# Patient Record
Sex: Female | Born: 1950 | Race: White | Hispanic: No | Marital: Married | Smoking: Former smoker
Health system: Southern US, Community
[De-identification: ages and names within clinical notes are randomized; demographics above are authoritative.]

## PROBLEM LIST (undated history)

## (undated) DIAGNOSIS — I10 Essential (primary) hypertension: Secondary | ICD-10-CM

## (undated) DIAGNOSIS — I6529 Occlusion and stenosis of unspecified carotid artery: Secondary | ICD-10-CM

## (undated) DIAGNOSIS — I2510 Atherosclerotic heart disease of native coronary artery without angina pectoris: Secondary | ICD-10-CM

## (undated) DIAGNOSIS — I4891 Unspecified atrial fibrillation: Secondary | ICD-10-CM

## (undated) DIAGNOSIS — I499 Cardiac arrhythmia, unspecified: Secondary | ICD-10-CM

## (undated) HISTORY — DX: Occlusion and stenosis of unspecified carotid artery: I65.29

## (undated) HISTORY — PX: ABDOMINAL HYSTERECTOMY: SHX81

## (undated) HISTORY — PX: TONSILLECTOMY: SUR1361

## (undated) HISTORY — PX: DILATION AND CURETTAGE OF UTERUS: SHX78

---

## 2018-02-12 ENCOUNTER — Other Ambulatory Visit: Payer: Self-pay | Admitting: Family Medicine

## 2018-02-12 DIAGNOSIS — M545 Low back pain: Secondary | ICD-10-CM

## 2018-02-22 ENCOUNTER — Ambulatory Visit
Admission: RE | Admit: 2018-02-22 | Discharge: 2018-02-22 | Disposition: A | Discharge status: Home / Self Care | Payer: Medicare PPO | Source: Ambulatory Visit | Attending: Family Medicine | Admitting: Family Medicine

## 2018-02-22 DIAGNOSIS — M545 Low back pain: Secondary | ICD-10-CM

## 2018-02-26 ENCOUNTER — Other Ambulatory Visit: Payer: Self-pay | Admitting: Family Medicine

## 2018-02-26 DIAGNOSIS — M545 Low back pain: Principal | ICD-10-CM

## 2018-02-26 DIAGNOSIS — G8929 Other chronic pain: Secondary | ICD-10-CM

## 2018-03-15 ENCOUNTER — Ambulatory Visit
Admission: RE | Admit: 2018-03-15 | Discharge: 2018-03-15 | Disposition: A | Discharge status: Home / Self Care | Payer: Medicare PPO | Source: Ambulatory Visit | Attending: Family Medicine | Admitting: Family Medicine

## 2018-03-15 DIAGNOSIS — M47817 Spondylosis without myelopathy or radiculopathy, lumbosacral region: Secondary | ICD-10-CM | POA: Diagnosis not present

## 2018-03-15 DIAGNOSIS — G8929 Other chronic pain: Secondary | ICD-10-CM

## 2018-03-15 DIAGNOSIS — M545 Low back pain: Principal | ICD-10-CM

## 2018-03-15 MED ORDER — METHYLPREDNISOLONE ACETATE 40 MG/ML INJ SUSP (RADIOLOG
120.0000 mg | Freq: Once | INTRAMUSCULAR | Status: AC
  Administered 2018-03-15: 120 mg via EPIDURAL

## 2018-03-15 MED ORDER — IOPAMIDOL (ISOVUE-M 200) INJECTION 41%
1.0000 mL | Freq: Once | INTRAMUSCULAR | Status: AC
  Administered 2018-03-15: 1 mL via EPIDURAL

## 2018-03-15 NOTE — Discharge Instructions (Signed)

## 2018-04-13 DIAGNOSIS — M545 Low back pain: Secondary | ICD-10-CM | POA: Diagnosis not present

## 2018-04-13 DIAGNOSIS — Z6828 Body mass index (BMI) 28.0-28.9, adult: Secondary | ICD-10-CM | POA: Diagnosis not present

## 2018-04-22 ENCOUNTER — Other Ambulatory Visit (HOSPITAL_COMMUNITY): Payer: Self-pay | Admitting: Neurosurgery

## 2018-04-22 DIAGNOSIS — M859 Disorder of bone density and structure, unspecified: Secondary | ICD-10-CM | POA: Diagnosis not present

## 2018-04-22 DIAGNOSIS — M4316 Spondylolisthesis, lumbar region: Secondary | ICD-10-CM | POA: Diagnosis not present

## 2018-04-22 DIAGNOSIS — E2839 Other primary ovarian failure: Secondary | ICD-10-CM

## 2018-04-22 DIAGNOSIS — M48062 Spinal stenosis, lumbar region with neurogenic claudication: Secondary | ICD-10-CM | POA: Diagnosis not present

## 2018-04-22 DIAGNOSIS — M47816 Spondylosis without myelopathy or radiculopathy, lumbar region: Secondary | ICD-10-CM | POA: Diagnosis not present

## 2018-04-28 DIAGNOSIS — M48061 Spinal stenosis, lumbar region without neurogenic claudication: Secondary | ICD-10-CM | POA: Diagnosis not present

## 2018-04-28 DIAGNOSIS — M48062 Spinal stenosis, lumbar region with neurogenic claudication: Secondary | ICD-10-CM | POA: Diagnosis not present

## 2018-04-28 DIAGNOSIS — M5126 Other intervertebral disc displacement, lumbar region: Secondary | ICD-10-CM | POA: Diagnosis not present

## 2018-04-28 DIAGNOSIS — I700 Atherosclerosis of aorta: Secondary | ICD-10-CM | POA: Diagnosis not present

## 2018-04-28 DIAGNOSIS — M545 Low back pain: Secondary | ICD-10-CM | POA: Diagnosis not present

## 2018-04-28 DIAGNOSIS — M47816 Spondylosis without myelopathy or radiculopathy, lumbar region: Secondary | ICD-10-CM | POA: Diagnosis not present

## 2018-04-28 DIAGNOSIS — K5732 Diverticulitis of large intestine without perforation or abscess without bleeding: Secondary | ICD-10-CM | POA: Diagnosis not present

## 2018-04-28 DIAGNOSIS — Z6828 Body mass index (BMI) 28.0-28.9, adult: Secondary | ICD-10-CM | POA: Diagnosis not present

## 2018-05-03 ENCOUNTER — Ambulatory Visit (HOSPITAL_COMMUNITY)
Admission: RE | Admit: 2018-05-03 | Discharge: 2018-05-03 | Disposition: A | Discharge status: Home / Self Care | Payer: Medicare PPO | Source: Ambulatory Visit | Attending: Neurosurgery | Admitting: Neurosurgery

## 2018-05-03 ENCOUNTER — Encounter (HOSPITAL_COMMUNITY): Payer: Self-pay | Admitting: Radiology

## 2018-05-03 DIAGNOSIS — E2839 Other primary ovarian failure: Secondary | ICD-10-CM | POA: Insufficient documentation

## 2018-05-03 DIAGNOSIS — M47816 Spondylosis without myelopathy or radiculopathy, lumbar region: Secondary | ICD-10-CM | POA: Diagnosis not present

## 2018-05-03 DIAGNOSIS — M48062 Spinal stenosis, lumbar region with neurogenic claudication: Secondary | ICD-10-CM | POA: Diagnosis not present

## 2018-05-03 DIAGNOSIS — M8589 Other specified disorders of bone density and structure, multiple sites: Secondary | ICD-10-CM | POA: Diagnosis not present

## 2018-05-03 DIAGNOSIS — M4316 Spondylolisthesis, lumbar region: Secondary | ICD-10-CM | POA: Diagnosis not present

## 2018-05-03 DIAGNOSIS — Z780 Asymptomatic menopausal state: Secondary | ICD-10-CM | POA: Diagnosis not present

## 2018-05-08 DIAGNOSIS — Z23 Encounter for immunization: Secondary | ICD-10-CM | POA: Diagnosis not present

## 2018-05-14 DIAGNOSIS — M48062 Spinal stenosis, lumbar region with neurogenic claudication: Secondary | ICD-10-CM | POA: Diagnosis not present

## 2018-05-14 DIAGNOSIS — M47816 Spondylosis without myelopathy or radiculopathy, lumbar region: Secondary | ICD-10-CM | POA: Diagnosis not present

## 2018-05-17 DIAGNOSIS — D6859 Other primary thrombophilia: Secondary | ICD-10-CM | POA: Diagnosis not present

## 2018-05-17 DIAGNOSIS — Z86718 Personal history of other venous thrombosis and embolism: Secondary | ICD-10-CM | POA: Diagnosis not present

## 2018-05-17 DIAGNOSIS — M48062 Spinal stenosis, lumbar region with neurogenic claudication: Secondary | ICD-10-CM | POA: Diagnosis not present

## 2018-05-17 DIAGNOSIS — Z8249 Family history of ischemic heart disease and other diseases of the circulatory system: Secondary | ICD-10-CM | POA: Diagnosis not present

## 2018-05-28 DIAGNOSIS — I10 Essential (primary) hypertension: Secondary | ICD-10-CM | POA: Diagnosis not present

## 2018-05-28 DIAGNOSIS — J329 Chronic sinusitis, unspecified: Secondary | ICD-10-CM | POA: Diagnosis not present

## 2018-05-28 DIAGNOSIS — M545 Low back pain: Secondary | ICD-10-CM | POA: Diagnosis not present

## 2018-05-31 DIAGNOSIS — Z01810 Encounter for preprocedural cardiovascular examination: Secondary | ICD-10-CM | POA: Diagnosis not present

## 2018-05-31 DIAGNOSIS — Z01818 Encounter for other preprocedural examination: Secondary | ICD-10-CM | POA: Diagnosis not present

## 2018-05-31 DIAGNOSIS — M48062 Spinal stenosis, lumbar region with neurogenic claudication: Secondary | ICD-10-CM | POA: Diagnosis not present

## 2018-05-31 DIAGNOSIS — Z538 Procedure and treatment not carried out for other reasons: Secondary | ICD-10-CM | POA: Diagnosis not present

## 2018-05-31 DIAGNOSIS — J90 Pleural effusion, not elsewhere classified: Secondary | ICD-10-CM | POA: Diagnosis not present

## 2018-05-31 DIAGNOSIS — R9431 Abnormal electrocardiogram [ECG] [EKG]: Secondary | ICD-10-CM | POA: Diagnosis not present

## 2018-05-31 DIAGNOSIS — I4891 Unspecified atrial fibrillation: Secondary | ICD-10-CM | POA: Diagnosis not present

## 2018-06-01 DIAGNOSIS — M48062 Spinal stenosis, lumbar region with neurogenic claudication: Secondary | ICD-10-CM | POA: Diagnosis not present

## 2018-06-01 DIAGNOSIS — Z881 Allergy status to other antibiotic agents status: Secondary | ICD-10-CM | POA: Diagnosis not present

## 2018-06-01 DIAGNOSIS — I4891 Unspecified atrial fibrillation: Secondary | ICD-10-CM | POA: Diagnosis not present

## 2018-06-01 DIAGNOSIS — Z79899 Other long term (current) drug therapy: Secondary | ICD-10-CM | POA: Diagnosis not present

## 2018-06-01 DIAGNOSIS — D6859 Other primary thrombophilia: Secondary | ICD-10-CM | POA: Diagnosis not present

## 2018-06-01 DIAGNOSIS — Z888 Allergy status to other drugs, medicaments and biological substances status: Secondary | ICD-10-CM | POA: Diagnosis not present

## 2018-06-01 DIAGNOSIS — Z7901 Long term (current) use of anticoagulants: Secondary | ICD-10-CM | POA: Diagnosis not present

## 2018-06-01 DIAGNOSIS — I509 Heart failure, unspecified: Secondary | ICD-10-CM | POA: Diagnosis not present

## 2018-06-01 DIAGNOSIS — G629 Polyneuropathy, unspecified: Secondary | ICD-10-CM | POA: Diagnosis not present

## 2018-06-01 DIAGNOSIS — I110 Hypertensive heart disease with heart failure: Secondary | ICD-10-CM | POA: Diagnosis not present

## 2018-06-01 DIAGNOSIS — Z8249 Family history of ischemic heart disease and other diseases of the circulatory system: Secondary | ICD-10-CM | POA: Diagnosis not present

## 2018-06-02 DIAGNOSIS — J90 Pleural effusion, not elsewhere classified: Secondary | ICD-10-CM | POA: Diagnosis not present

## 2018-06-02 DIAGNOSIS — I4891 Unspecified atrial fibrillation: Secondary | ICD-10-CM | POA: Diagnosis not present

## 2018-06-02 DIAGNOSIS — R9431 Abnormal electrocardiogram [ECG] [EKG]: Secondary | ICD-10-CM | POA: Diagnosis not present

## 2018-06-02 DIAGNOSIS — I517 Cardiomegaly: Secondary | ICD-10-CM | POA: Diagnosis not present

## 2018-06-02 DIAGNOSIS — Z01810 Encounter for preprocedural cardiovascular examination: Secondary | ICD-10-CM | POA: Diagnosis not present

## 2018-06-04 DIAGNOSIS — Z01810 Encounter for preprocedural cardiovascular examination: Secondary | ICD-10-CM | POA: Diagnosis not present

## 2018-06-04 DIAGNOSIS — I4819 Other persistent atrial fibrillation: Secondary | ICD-10-CM | POA: Diagnosis not present

## 2018-06-04 DIAGNOSIS — G478 Other sleep disorders: Secondary | ICD-10-CM | POA: Diagnosis not present

## 2018-06-04 DIAGNOSIS — R9431 Abnormal electrocardiogram [ECG] [EKG]: Secondary | ICD-10-CM | POA: Diagnosis not present

## 2018-06-04 DIAGNOSIS — I4891 Unspecified atrial fibrillation: Secondary | ICD-10-CM | POA: Diagnosis not present

## 2018-06-14 DIAGNOSIS — K5733 Diverticulitis of large intestine without perforation or abscess with bleeding: Secondary | ICD-10-CM | POA: Diagnosis not present

## 2018-06-14 DIAGNOSIS — Z6827 Body mass index (BMI) 27.0-27.9, adult: Secondary | ICD-10-CM | POA: Diagnosis not present

## 2018-06-14 DIAGNOSIS — M8580 Other specified disorders of bone density and structure, unspecified site: Secondary | ICD-10-CM | POA: Diagnosis not present

## 2018-06-14 DIAGNOSIS — I4891 Unspecified atrial fibrillation: Secondary | ICD-10-CM | POA: Diagnosis not present

## 2018-06-30 DIAGNOSIS — Z8249 Family history of ischemic heart disease and other diseases of the circulatory system: Secondary | ICD-10-CM | POA: Diagnosis not present

## 2018-06-30 DIAGNOSIS — D6859 Other primary thrombophilia: Secondary | ICD-10-CM | POA: Diagnosis not present

## 2018-07-01 DIAGNOSIS — I700 Atherosclerosis of aorta: Secondary | ICD-10-CM | POA: Diagnosis not present

## 2018-07-01 DIAGNOSIS — Z720 Tobacco use: Secondary | ICD-10-CM | POA: Diagnosis not present

## 2018-07-01 DIAGNOSIS — I2510 Atherosclerotic heart disease of native coronary artery without angina pectoris: Secondary | ICD-10-CM | POA: Diagnosis not present

## 2018-07-01 DIAGNOSIS — I10 Essential (primary) hypertension: Secondary | ICD-10-CM | POA: Diagnosis not present

## 2018-07-01 DIAGNOSIS — I4891 Unspecified atrial fibrillation: Secondary | ICD-10-CM | POA: Diagnosis not present

## 2018-07-02 DIAGNOSIS — I4891 Unspecified atrial fibrillation: Secondary | ICD-10-CM | POA: Diagnosis not present

## 2018-07-02 DIAGNOSIS — Z23 Encounter for immunization: Secondary | ICD-10-CM | POA: Diagnosis not present

## 2018-07-02 DIAGNOSIS — M48062 Spinal stenosis, lumbar region with neurogenic claudication: Secondary | ICD-10-CM | POA: Diagnosis not present

## 2018-07-21 DIAGNOSIS — I4891 Unspecified atrial fibrillation: Secondary | ICD-10-CM | POA: Diagnosis not present

## 2018-07-21 DIAGNOSIS — M545 Low back pain: Secondary | ICD-10-CM | POA: Diagnosis not present

## 2018-07-21 DIAGNOSIS — I10 Essential (primary) hypertension: Secondary | ICD-10-CM | POA: Diagnosis not present

## 2018-07-26 DIAGNOSIS — Z0001 Encounter for general adult medical examination with abnormal findings: Secondary | ICD-10-CM | POA: Diagnosis not present

## 2018-07-26 DIAGNOSIS — F17210 Nicotine dependence, cigarettes, uncomplicated: Secondary | ICD-10-CM | POA: Diagnosis not present

## 2018-07-26 DIAGNOSIS — Z6827 Body mass index (BMI) 27.0-27.9, adult: Secondary | ICD-10-CM | POA: Diagnosis not present

## 2018-07-26 DIAGNOSIS — I10 Essential (primary) hypertension: Secondary | ICD-10-CM | POA: Diagnosis not present

## 2018-07-29 DIAGNOSIS — M48062 Spinal stenosis, lumbar region with neurogenic claudication: Secondary | ICD-10-CM | POA: Diagnosis not present

## 2018-07-29 DIAGNOSIS — Z23 Encounter for immunization: Secondary | ICD-10-CM | POA: Diagnosis not present

## 2018-07-29 DIAGNOSIS — I4891 Unspecified atrial fibrillation: Secondary | ICD-10-CM | POA: Diagnosis not present

## 2018-07-29 DIAGNOSIS — M810 Age-related osteoporosis without current pathological fracture: Secondary | ICD-10-CM | POA: Diagnosis not present

## 2018-08-20 DIAGNOSIS — Z1231 Encounter for screening mammogram for malignant neoplasm of breast: Secondary | ICD-10-CM | POA: Diagnosis not present

## 2018-08-31 DIAGNOSIS — Z6827 Body mass index (BMI) 27.0-27.9, adult: Secondary | ICD-10-CM | POA: Diagnosis not present

## 2018-08-31 DIAGNOSIS — K5732 Diverticulitis of large intestine without perforation or abscess without bleeding: Secondary | ICD-10-CM | POA: Diagnosis not present

## 2018-09-08 DIAGNOSIS — I4811 Longstanding persistent atrial fibrillation: Secondary | ICD-10-CM | POA: Diagnosis not present

## 2018-09-17 DIAGNOSIS — I700 Atherosclerosis of aorta: Secondary | ICD-10-CM | POA: Diagnosis not present

## 2018-09-17 DIAGNOSIS — K5730 Diverticulosis of large intestine without perforation or abscess without bleeding: Secondary | ICD-10-CM | POA: Diagnosis not present

## 2018-09-29 DIAGNOSIS — M8580 Other specified disorders of bone density and structure, unspecified site: Secondary | ICD-10-CM | POA: Diagnosis not present

## 2018-09-29 DIAGNOSIS — D6859 Other primary thrombophilia: Secondary | ICD-10-CM | POA: Diagnosis not present

## 2018-09-29 DIAGNOSIS — Z965 Presence of tooth-root and mandibular implants: Secondary | ICD-10-CM | POA: Diagnosis not present

## 2018-09-29 DIAGNOSIS — I4811 Longstanding persistent atrial fibrillation: Secondary | ICD-10-CM | POA: Diagnosis not present

## 2018-09-29 DIAGNOSIS — M48062 Spinal stenosis, lumbar region with neurogenic claudication: Secondary | ICD-10-CM | POA: Diagnosis not present

## 2018-10-25 DIAGNOSIS — Z6826 Body mass index (BMI) 26.0-26.9, adult: Secondary | ICD-10-CM | POA: Diagnosis not present

## 2018-10-25 DIAGNOSIS — K5792 Diverticulitis of intestine, part unspecified, without perforation or abscess without bleeding: Secondary | ICD-10-CM | POA: Diagnosis not present

## 2018-10-25 DIAGNOSIS — J329 Chronic sinusitis, unspecified: Secondary | ICD-10-CM | POA: Diagnosis not present

## 2018-11-02 DIAGNOSIS — R509 Fever, unspecified: Secondary | ICD-10-CM | POA: Diagnosis not present

## 2018-11-08 DIAGNOSIS — R197 Diarrhea, unspecified: Secondary | ICD-10-CM | POA: Diagnosis not present

## 2018-11-09 DIAGNOSIS — R197 Diarrhea, unspecified: Secondary | ICD-10-CM | POA: Diagnosis not present

## 2018-11-26 DIAGNOSIS — A0472 Enterocolitis due to Clostridium difficile, not specified as recurrent: Secondary | ICD-10-CM | POA: Diagnosis not present

## 2018-11-26 DIAGNOSIS — Z6825 Body mass index (BMI) 25.0-25.9, adult: Secondary | ICD-10-CM | POA: Diagnosis not present

## 2018-12-08 DIAGNOSIS — I4819 Other persistent atrial fibrillation: Secondary | ICD-10-CM | POA: Diagnosis not present

## 2018-12-14 DIAGNOSIS — Z6825 Body mass index (BMI) 25.0-25.9, adult: Secondary | ICD-10-CM | POA: Diagnosis not present

## 2018-12-14 DIAGNOSIS — A0472 Enterocolitis due to Clostridium difficile, not specified as recurrent: Secondary | ICD-10-CM | POA: Diagnosis not present

## 2019-01-12 DIAGNOSIS — Z1159 Encounter for screening for other viral diseases: Secondary | ICD-10-CM | POA: Diagnosis not present

## 2019-01-14 DIAGNOSIS — R011 Cardiac murmur, unspecified: Secondary | ICD-10-CM | POA: Diagnosis not present

## 2019-01-14 DIAGNOSIS — I4819 Other persistent atrial fibrillation: Secondary | ICD-10-CM | POA: Diagnosis not present

## 2019-01-14 DIAGNOSIS — M1990 Unspecified osteoarthritis, unspecified site: Secondary | ICD-10-CM | POA: Diagnosis not present

## 2019-01-14 DIAGNOSIS — I10 Essential (primary) hypertension: Secondary | ICD-10-CM | POA: Diagnosis not present

## 2019-01-14 DIAGNOSIS — I4891 Unspecified atrial fibrillation: Secondary | ICD-10-CM | POA: Diagnosis not present

## 2019-01-14 DIAGNOSIS — G629 Polyneuropathy, unspecified: Secondary | ICD-10-CM | POA: Diagnosis not present

## 2019-01-14 DIAGNOSIS — Z888 Allergy status to other drugs, medicaments and biological substances status: Secondary | ICD-10-CM | POA: Diagnosis not present

## 2019-01-14 DIAGNOSIS — Z881 Allergy status to other antibiotic agents status: Secondary | ICD-10-CM | POA: Diagnosis not present

## 2019-01-24 DIAGNOSIS — I4819 Other persistent atrial fibrillation: Secondary | ICD-10-CM | POA: Diagnosis not present

## 2019-01-24 DIAGNOSIS — I10 Essential (primary) hypertension: Secondary | ICD-10-CM | POA: Diagnosis not present

## 2019-01-24 DIAGNOSIS — I5032 Chronic diastolic (congestive) heart failure: Secondary | ICD-10-CM | POA: Diagnosis not present

## 2019-01-24 DIAGNOSIS — E559 Vitamin D deficiency, unspecified: Secondary | ICD-10-CM | POA: Diagnosis not present

## 2019-01-24 DIAGNOSIS — D6859 Other primary thrombophilia: Secondary | ICD-10-CM | POA: Diagnosis not present

## 2019-01-28 DIAGNOSIS — M8580 Other specified disorders of bone density and structure, unspecified site: Secondary | ICD-10-CM | POA: Diagnosis not present

## 2019-01-28 DIAGNOSIS — M48062 Spinal stenosis, lumbar region with neurogenic claudication: Secondary | ICD-10-CM | POA: Diagnosis not present

## 2019-01-28 DIAGNOSIS — I4811 Longstanding persistent atrial fibrillation: Secondary | ICD-10-CM | POA: Diagnosis not present

## 2019-02-10 DIAGNOSIS — A0472 Enterocolitis due to Clostridium difficile, not specified as recurrent: Secondary | ICD-10-CM | POA: Diagnosis not present

## 2019-02-10 DIAGNOSIS — Z6826 Body mass index (BMI) 26.0-26.9, adult: Secondary | ICD-10-CM | POA: Diagnosis not present

## 2019-02-15 DIAGNOSIS — A0472 Enterocolitis due to Clostridium difficile, not specified as recurrent: Secondary | ICD-10-CM | POA: Diagnosis not present

## 2019-04-04 DIAGNOSIS — R1032 Left lower quadrant pain: Secondary | ICD-10-CM | POA: Diagnosis not present

## 2019-04-05 DIAGNOSIS — I4891 Unspecified atrial fibrillation: Secondary | ICD-10-CM | POA: Diagnosis not present

## 2019-04-05 DIAGNOSIS — K5732 Diverticulitis of large intestine without perforation or abscess without bleeding: Secondary | ICD-10-CM | POA: Diagnosis not present

## 2019-04-05 DIAGNOSIS — R1032 Left lower quadrant pain: Secondary | ICD-10-CM | POA: Diagnosis not present

## 2019-04-05 DIAGNOSIS — F17210 Nicotine dependence, cigarettes, uncomplicated: Secondary | ICD-10-CM | POA: Diagnosis not present

## 2019-04-08 DIAGNOSIS — R109 Unspecified abdominal pain: Secondary | ICD-10-CM | POA: Diagnosis not present

## 2019-04-25 DIAGNOSIS — I5032 Chronic diastolic (congestive) heart failure: Secondary | ICD-10-CM | POA: Diagnosis not present

## 2019-04-25 DIAGNOSIS — I10 Essential (primary) hypertension: Secondary | ICD-10-CM | POA: Diagnosis not present

## 2019-04-25 DIAGNOSIS — I4811 Longstanding persistent atrial fibrillation: Secondary | ICD-10-CM | POA: Diagnosis not present

## 2019-04-26 DIAGNOSIS — E559 Vitamin D deficiency, unspecified: Secondary | ICD-10-CM | POA: Diagnosis not present

## 2019-04-26 DIAGNOSIS — I10 Essential (primary) hypertension: Secondary | ICD-10-CM | POA: Diagnosis not present

## 2019-04-26 DIAGNOSIS — D6859 Other primary thrombophilia: Secondary | ICD-10-CM | POA: Diagnosis not present

## 2019-04-26 DIAGNOSIS — M8580 Other specified disorders of bone density and structure, unspecified site: Secondary | ICD-10-CM | POA: Diagnosis not present

## 2019-04-26 DIAGNOSIS — I5032 Chronic diastolic (congestive) heart failure: Secondary | ICD-10-CM | POA: Diagnosis not present

## 2019-04-26 DIAGNOSIS — Z965 Presence of tooth-root and mandibular implants: Secondary | ICD-10-CM | POA: Diagnosis not present

## 2019-05-02 DIAGNOSIS — M8580 Other specified disorders of bone density and structure, unspecified site: Secondary | ICD-10-CM | POA: Diagnosis not present

## 2019-05-02 DIAGNOSIS — I5032 Chronic diastolic (congestive) heart failure: Secondary | ICD-10-CM | POA: Diagnosis not present

## 2019-05-02 DIAGNOSIS — I4811 Longstanding persistent atrial fibrillation: Secondary | ICD-10-CM | POA: Diagnosis not present

## 2019-05-02 DIAGNOSIS — D6859 Other primary thrombophilia: Secondary | ICD-10-CM | POA: Diagnosis not present

## 2019-05-06 IMAGING — XA Imaging study
2 series · 2 of 2 positions shown · non-contrast
Comparison: none

CLINICAL DATA: Spondylosis without myelopathy. Back pain. Left leg
pain radiating all the way to the foot.

[Series 1: ortho standard · 1 of 1 slices shown (1 of 2)]
[im 1/1]
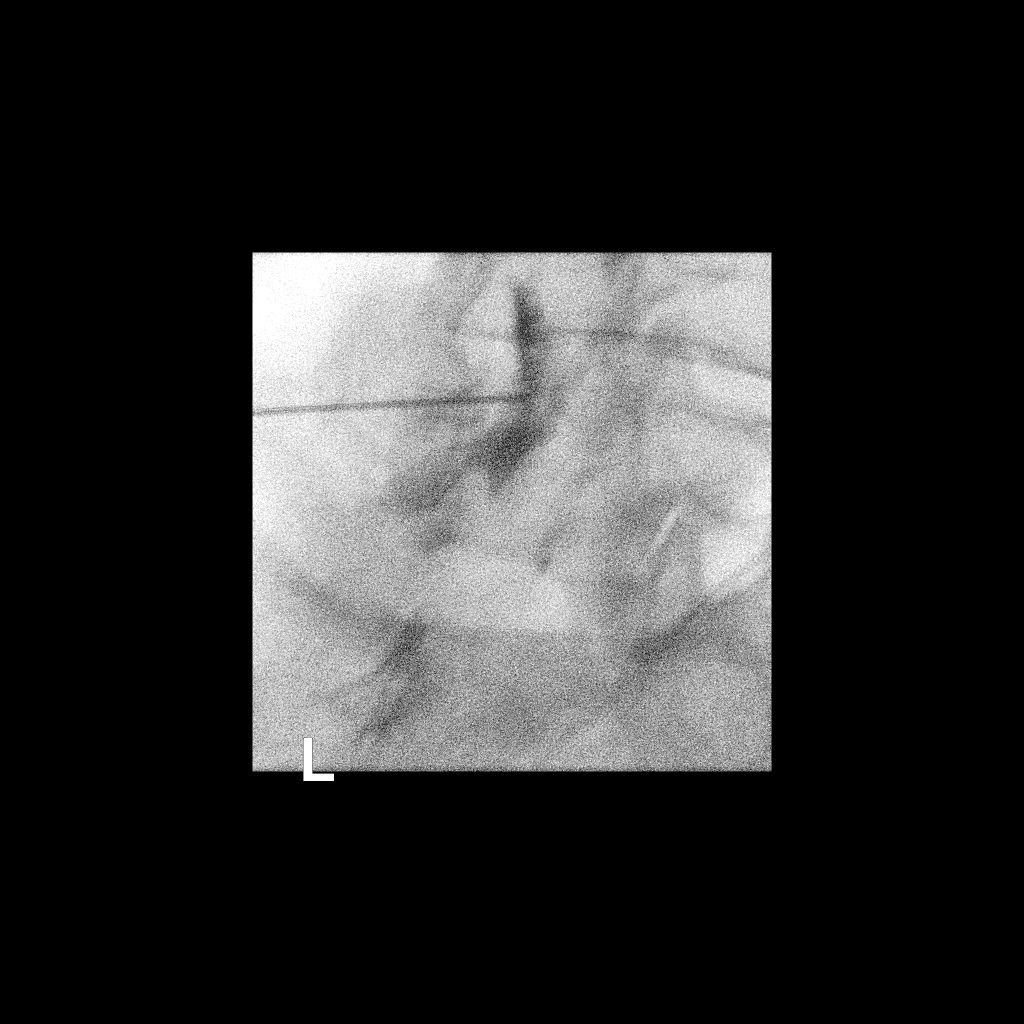

[Series 2: ortho standard · 1 of 1 slices shown (2 of 2)]
[im 1/1]
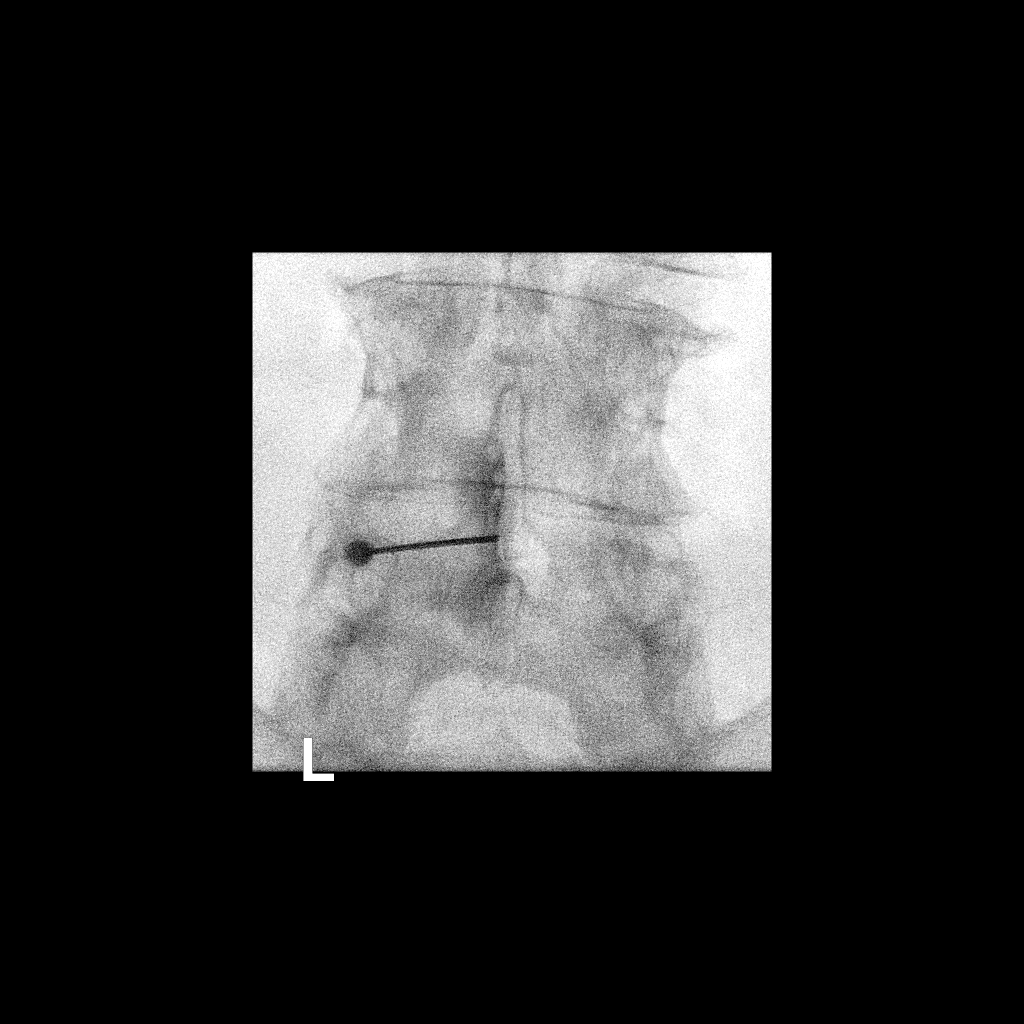

[2 of 2 positions shown; findings below may reference images not displayed]

FLUOROSCOPY TIME:  0 minutes 27 seconds. 13.77 micro gray meter
squared

PROCEDURE:
The procedure, risks, benefits, and alternatives were explained to
the patient. Questions regarding the procedure were encouraged and
answered. The patient understands and consents to the procedure.

LUMBAR EPIDURAL INJECTION:

An interlaminar approach was performed on the left at L4-5. The
overlying skin was cleansed and anesthetized. A 20 gauge epidural
needle was advanced using loss-of-resistance technique.

DIAGNOSTIC EPIDURAL INJECTION:

Injection of Isovue-M 200 shows a good epidural pattern with spread
above and below the level of needle placement, primarily on the
left. No vascular opacification is seen.

THERAPEUTIC EPIDURAL INJECTION:

One hundred twenty mg of Depo-Medrol mixed with 2 cc 1% lidocaine
were instilled. The procedure was well-tolerated, and the patient
was discharged thirty minutes following the injection in good
condition.

COMPLICATIONS:
None
IMPRESSION: Technically successful epidural injection on the left at L4-5 # 1.

## 2019-05-08 NOTE — Progress Notes (Addendum)
Subjective:    Patient ID: Angela Stein, female    DOB: [birth year], 68 y.o.   MRN: [id number]  HPI  Angela Stein is a 68 year old female with a past medical history significant for atrial fibrillation diagnosed 2018, CHF, diverticulitis 06/2018, 08/2018, 10/2018 and July 2020 treated by her primary care physician.  C. Diff 11/2018 and 02/2019 treated with Flagyl and Vancomycin by her primary care physician.  Surgical history significant for tonsillectomy and a partial hysterectomy 1975.   She reports having numerous episodes of diverticulitis which presented with left lower quadrant abdominal pain and diarrhea.  Her diarrhea is sometimes worsened after she took an antibiotic.  She reported having bloody diarrhea during 1 of the episodes of presumed diverticulitis.  She underwent an abdominal/pelvic CT 09/17/2018 at [facility] rocking him which identified moderate sigmoid diverticulosis without evidence of diverticulitis.  No evidence of colitis was noted. She is also having LUQ pain that is worse during her episodes of diarrhea. No heartburn or dysphagia. She reports having chronic left lower abdominal pain which is worse at the end of the day.  She is currently passing 1-4 soft brown bowel movements daily.  No further diarrhea.  No rectal bleeding or black stools.  She reported completing a colonoscopy 5 years ago in [place] which showed diverticulosis, no polyps.  No family history of colorectal cancer.  No fever, sweats or chills.  No weight loss.  Review of her records from her PCP: Diverticulitis: 05/2018: Augmentin '875mg'$  po bid x 5 days 06/2018: Flagyl '500mg'$  and Bactrim DS bid x 10 days  08/2018: Flagyl '500mg'$  and Bactrim DS bid x 10 days then Augmentin '875mg'$  bid x 10 days 10/2018: Augmentin '875mg'$  bid x 10 days  C. Diff: 10/30/2018: Vanco '125mg'$  po QD x 10 days 11/26/2018: Vanco '125mg'$  po QID x 10 days and Flagyl '500mg'$  po tid x 10 days 12/14/2018 02/12/2019 Inactive FOR C. DIFFICILE COLITIS. TAKE ONE  FOUR TIMES A DAY FOR 10 DAYS, THEN ONE THREE TIMES A DAY FOR 10 DAYS, THEN ONE TWO TIMES A DAY FOR 10 DAYS, THEN ONE A DAY FOR 10 DAYS , THEN ONE EVERY OTHER DAY FOR 20 DAYS  02/10/2019: Flagyl '500mg'$  tid x 5 days 02/15/2019: Vanco '125mg'$  po QID x 10 days   Current Outpatient Medications on File Prior to Visit  Medication Sig Dispense Refill  . apixaban (ELIQUIS) 5 MG TABS tablet Take by mouth 2 (two) times daily.     . Calcium Carbonate (CALCIUM 600 PO) Take 600 mg by mouth daily.    . Cholecalciferol (VITAMIN D3) 25 MCG (1000 UT) CAPS Take by mouth daily.     . furosemide (LASIX) 40 MG tablet Take 20 mg by mouth as needed.     . hydrochlorothiazide (HYDRODIURIL) 12.5 MG tablet Take 12.5 mg by mouth daily.    . metoprolol tartrate (LOPRESSOR) 50 MG tablet Take 50 mg by mouth 2 (two) times daily. Patient takes 75 mg by mouth twice daily.    Marland Kitchen saccharomyces boulardii (FLORASTOR) 250 MG capsule Take 250 mg by mouth 2 (two) times daily.    . valACYclovir (VALTREX) 1000 MG tablet Take 1,000 mg by mouth as needed.     No current facility-administered medications on file prior to visit.    Allergies  Allergen Reactions  . Ciprofloxacin Hives  . Lisinopril Swelling   Family History: Mother died age 46 complications from hip surgery, PE. Father died from Alzheimer's age 62.  Brothers  3, 5 sisters. Oldest brother with DM an heart dz.  Oldest sister breast cancer. Sister with RA.   Social History:  Married. One adult son. She smoke < 1/2 ppd cigarettes 50 years. Infrequent alcohol.   Review of Systems see HPI, all other systems reviewed and are negative     Objective:   Physical Exam  Blood pressure 138/90, pulse 93, temperature 98.4 F (36.9 C), temperature source Oral, height '5\' 1"'$  (1.549 m), weight 134 lb (60.8 kg). General: 68 year old female developed in no acute distress Eyes: Sclera nonicteric, conjunctiva pink Mouth: Dentition intact, no ulcers or lesions Neck: Supple, no  lymphadenopathy or thyromegaly Heart: Irregular rhythm, no murmurs Lungs: Breath sounds clear throughout. Abdomen: Soft, nontender, no masses or organomegaly, positive bowel sounds all 4 quadrants Extremities: No edema Neuro: Alert and oriented x4, no focal deficits     Assessment & Plan:  53.  68 year old female with recurrent episodes of diarrhea and left lower quadrant abdominal pain suspicious for colitis rather than diverticulitis. -Schedule a colonoscopy, benefits and risk discussed including risk of sedation, risk of bleeding, perforation and infection -Our office will contact the patient's cardiologist to verify if okay to hold Eliquis for 2 days prior to colonoscopy -Request copy of CBC and CMP from PCP's office   2.  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  No current diarrhea. -Continue Florastor 1 capsule twice daily  3.  Left upper quadrant abdominal pain -EGD to be done at the time of her colonoscopy, rule out peptic ulcer disease  (pt is at higher risk of GI bleed on Eliqis for afib). -further recommendations regarding EGD per Dr. Laural Golden.  ADDENDUM 05/11/2019: LABS RECEIVED FROM PCP. LABS 04/26/2019:WBC 6.7. HG 15. HCT 43. PLT 243.BUN 17. CR.0.67. NA 133. ALK PHOS 93. AST 16. ALT 11.

## 2019-05-10 ENCOUNTER — Encounter (INDEPENDENT_AMBULATORY_CARE_PROVIDER_SITE_OTHER): Payer: Self-pay | Admitting: *Deleted

## 2019-05-10 ENCOUNTER — Encounter (INDEPENDENT_AMBULATORY_CARE_PROVIDER_SITE_OTHER): Payer: Self-pay | Admitting: Nurse Practitioner

## 2019-05-10 ENCOUNTER — Ambulatory Visit (INDEPENDENT_AMBULATORY_CARE_PROVIDER_SITE_OTHER): Payer: Medicare PPO | Admitting: Nurse Practitioner

## 2019-05-10 ENCOUNTER — Other Ambulatory Visit: Payer: Self-pay

## 2019-05-10 ENCOUNTER — Telehealth (INDEPENDENT_AMBULATORY_CARE_PROVIDER_SITE_OTHER): Payer: Self-pay | Admitting: *Deleted

## 2019-05-10 DIAGNOSIS — I4819 Other persistent atrial fibrillation: Secondary | ICD-10-CM

## 2019-05-10 DIAGNOSIS — M8580 Other specified disorders of bone density and structure, unspecified site: Secondary | ICD-10-CM | POA: Diagnosis not present

## 2019-05-10 DIAGNOSIS — I4891 Unspecified atrial fibrillation: Secondary | ICD-10-CM | POA: Insufficient documentation

## 2019-05-10 DIAGNOSIS — R1032 Left lower quadrant pain: Secondary | ICD-10-CM | POA: Diagnosis not present

## 2019-05-10 DIAGNOSIS — R1012 Left upper quadrant pain: Secondary | ICD-10-CM | POA: Diagnosis not present

## 2019-05-10 MED ORDER — PEG 3350-KCL-NA BICARB-NACL 420 G PO SOLR: 4000.0000 mL | Freq: Once | ORAL | 0 refills | Status: AC

## 2019-05-10 NOTE — Patient Instructions (Signed)
1. Schedule an EGD and colonoscopy   2. Call our office if your abdominal pain worsens   3. Further follow up to be determined after EGD and colonoscopy completed

## 2019-05-10 NOTE — Telephone Encounter (Signed)
Patient needs trilyte TCS/EGD sch'd 11/4

## 2019-05-11 ENCOUNTER — Encounter (INDEPENDENT_AMBULATORY_CARE_PROVIDER_SITE_OTHER): Payer: Self-pay | Admitting: *Deleted

## 2019-05-12 ENCOUNTER — Telehealth (INDEPENDENT_AMBULATORY_CARE_PROVIDER_SITE_OTHER): Payer: Self-pay | Admitting: *Deleted

## 2019-05-12 NOTE — Telephone Encounter (Signed)
Per Dr Dot Lanes ([place]) it is ok for patient to hold Eliquis 2 days before procedure, patient is aware to hold

## 2019-06-06 DIAGNOSIS — I4819 Other persistent atrial fibrillation: Secondary | ICD-10-CM | POA: Diagnosis not present

## 2019-06-09 ENCOUNTER — Other Ambulatory Visit (INDEPENDENT_AMBULATORY_CARE_PROVIDER_SITE_OTHER): Payer: Self-pay | Admitting: *Deleted

## 2019-06-13 ENCOUNTER — Other Ambulatory Visit: Payer: Self-pay

## 2019-06-13 ENCOUNTER — Other Ambulatory Visit (HOSPITAL_COMMUNITY)
Admission: RE | Admit: 2019-06-13 | Discharge: 2019-06-13 | Disposition: A | Discharge status: Home / Self Care | Payer: Medicare PPO | Source: Ambulatory Visit | Attending: Internal Medicine | Admitting: Internal Medicine

## 2019-06-13 DIAGNOSIS — Z01812 Encounter for preprocedural laboratory examination: Secondary | ICD-10-CM | POA: Diagnosis not present

## 2019-06-13 DIAGNOSIS — Z20828 Contact with and (suspected) exposure to other viral communicable diseases: Secondary | ICD-10-CM | POA: Insufficient documentation

## 2019-06-13 LAB — SARS CORONAVIRUS 2 (TAT 6-24 HRS): SARS Coronavirus 2: NEGATIVE

## 2019-06-15 ENCOUNTER — Other Ambulatory Visit: Payer: Self-pay

## 2019-06-15 ENCOUNTER — Encounter (HOSPITAL_COMMUNITY): Payer: Self-pay | Admitting: *Deleted

## 2019-06-15 ENCOUNTER — Encounter (HOSPITAL_COMMUNITY)
Admission: RE | Disposition: A | Discharge status: Home / Self Care | Payer: Self-pay | Source: Home / Self Care | Attending: Internal Medicine

## 2019-06-15 ENCOUNTER — Ambulatory Visit (HOSPITAL_COMMUNITY)
Admission: RE | Admit: 2019-06-15 | Discharge: 2019-06-15 | Disposition: A | Discharge status: Home / Self Care | Payer: Medicare PPO | Attending: Internal Medicine | Admitting: Internal Medicine

## 2019-06-15 DIAGNOSIS — Z79899 Other long term (current) drug therapy: Secondary | ICD-10-CM | POA: Diagnosis not present

## 2019-06-15 DIAGNOSIS — K5730 Diverticulosis of large intestine without perforation or abscess without bleeding: Secondary | ICD-10-CM | POA: Insufficient documentation

## 2019-06-15 DIAGNOSIS — Z7901 Long term (current) use of anticoagulants: Secondary | ICD-10-CM | POA: Insufficient documentation

## 2019-06-15 DIAGNOSIS — K644 Residual hemorrhoidal skin tags: Secondary | ICD-10-CM | POA: Insufficient documentation

## 2019-06-15 DIAGNOSIS — R1012 Left upper quadrant pain: Secondary | ICD-10-CM | POA: Insufficient documentation

## 2019-06-15 DIAGNOSIS — K635 Polyp of colon: Secondary | ICD-10-CM | POA: Diagnosis not present

## 2019-06-15 DIAGNOSIS — Z8619 Personal history of other infectious and parasitic diseases: Secondary | ICD-10-CM | POA: Diagnosis not present

## 2019-06-15 DIAGNOSIS — I4891 Unspecified atrial fibrillation: Secondary | ICD-10-CM | POA: Diagnosis not present

## 2019-06-15 DIAGNOSIS — R1032 Left lower quadrant pain: Secondary | ICD-10-CM | POA: Insufficient documentation

## 2019-06-15 DIAGNOSIS — F17210 Nicotine dependence, cigarettes, uncomplicated: Secondary | ICD-10-CM | POA: Insufficient documentation

## 2019-06-15 DIAGNOSIS — D127 Benign neoplasm of rectosigmoid junction: Secondary | ICD-10-CM | POA: Diagnosis not present

## 2019-06-15 DIAGNOSIS — Z09 Encounter for follow-up examination after completed treatment for conditions other than malignant neoplasm: Secondary | ICD-10-CM | POA: Diagnosis not present

## 2019-06-15 DIAGNOSIS — K5732 Diverticulitis of large intestine without perforation or abscess without bleeding: Secondary | ICD-10-CM | POA: Diagnosis not present

## 2019-06-15 HISTORY — DX: Unspecified atrial fibrillation: I48.91

## 2019-06-15 HISTORY — PX: POLYPECTOMY: SHX5525

## 2019-06-15 HISTORY — PX: COLONOSCOPY: SHX5424

## 2019-06-15 SURGERY — COLONOSCOPY
Anesthesia: Moderate Sedation

## 2019-06-15 MED ORDER — SODIUM CHLORIDE 0.9 % IV SOLN
INTRAVENOUS | Status: DC
  Administered 2019-06-15: 1000 mL via INTRAVENOUS

## 2019-06-15 MED ORDER — MEPERIDINE HCL 50 MG/ML IJ SOLN
INTRAMUSCULAR | Status: DC | PRN
  Administered 2019-06-15 (×2): 25 mg via INTRAVENOUS

## 2019-06-15 MED ORDER — LIDOCAINE VISCOUS HCL 2 % MT SOLN
OROMUCOSAL | Status: AC
  Filled 2019-06-15: qty 15

## 2019-06-15 MED ORDER — MEPERIDINE HCL 50 MG/ML IJ SOLN
INTRAMUSCULAR | Status: AC
  Filled 2019-06-15: qty 1

## 2019-06-15 MED ORDER — MIDAZOLAM HCL 5 MG/5ML IJ SOLN
INTRAMUSCULAR | Status: AC
  Filled 2019-06-15: qty 10

## 2019-06-15 MED ORDER — MIDAZOLAM HCL 5 MG/5ML IJ SOLN
INTRAMUSCULAR | Status: DC | PRN
  Administered 2019-06-15 (×2): 2 mg via INTRAVENOUS
  Administered 2019-06-15: 1 mg via INTRAVENOUS

## 2019-06-15 NOTE — Discharge Instructions (Signed)
Resume Eliquis/apixaban tomorrow evening. Resume other medications as before.. High-fiber diet. No driving for 24 hours. Physician will call with biopsy results.   Colonoscopy, Adult, Care After This sheet gives you information about how to care for yourself after your procedure. Your doctor may also give you more specific instructions. If you have problems or questions, call your doctor. What can I expect after the procedure? After the procedure, it is common to have:  A small amount of blood in your poop for 24 hours.  Some gas.  Mild cramping or bloating in your belly. Follow these instructions at home: General instructions  For the first 24 hours after the procedure: ? Do not drive or use machinery. ? Do not sign important documents. ? Do not drink alcohol. ? Do your daily activities more slowly than normal. ? Eat foods that are soft and easy to digest.  Take over-the-counter or prescription medicines only as told by your doctor. To help cramping and bloating:   Try walking around.  Put heat on your belly (abdomen) as told by your doctor. Use a heat source that your doctor recommends, such as a moist heat pack or a heating pad. ? Put a towel between your skin and the heat source. ? Leave the heat on for 20-30 minutes. ? Remove the heat if your skin turns bright red. This is especially important if you cannot feel pain, heat, or cold. You can get burned. Eating and drinking   Drink enough fluid to keep your pee (urine) clear or pale yellow.  Return to your normal diet as told by your doctor. Avoid heavy or fried foods that are hard to digest.  Avoid drinking alcohol for as long as told by your doctor. Contact a doctor if:  You have blood in your poop (stool) 2-3 days after the procedure. Get help right away if:  You have more than a small amount of blood in your poop.  You see large clumps of tissue (blood clots) in your poop.  Your belly is swollen.  You  feel sick to your stomach (nauseous).  You throw up (vomit).  You have a fever.  You have belly pain that gets worse, and medicine does not help your pain. Summary  After the procedure, it is common to have a small amount of blood in your poop. You may also have mild cramping and bloating in your belly.  For the first 24 hours after the procedure, do not drive or use machinery, do not sign important documents, and do not drink alcohol.  Get help right away if you have a lot of blood in your poop, feel sick to your stomach, have a fever, or have more belly pain. This information is not intended to replace advice given to you by your health care provider. Make sure you discuss any questions you have with your health care provider. Document Released: 08/30/2010 Document Revised: 05/28/2017 Document Reviewed: 04/21/2016 Elsevier Patient Education  2020 [employer].  Diverticulosis  Diverticulosis is a condition that develops when small pouches (diverticula) form in the wall of the large intestine (colon). The colon is where water is absorbed and stool is formed. The pouches form when the inside layer of the colon pushes through weak spots in the outer layers of the colon. You may have a few pouches or many of them. What are the causes? The cause of this condition is not known. What increases the risk? The following factors may make you more likely to  develop this condition:  Being older than age 44. Your risk for this condition increases with age. Diverticulosis is rare among people younger than age 64. By age 41, many people have it.  Eating a low-fiber diet.  Having frequent constipation.  Being overweight.  Not getting enough exercise.  Smoking.  Taking over-the-counter pain medicines, like aspirin and ibuprofen.  Having a family history of diverticulosis. What are the signs or symptoms? In most people, there are no symptoms of this condition. If you do have symptoms, they  may include:  Bloating.  Cramps in the abdomen.  Constipation or diarrhea.  Pain in the lower left side of the abdomen. How is this diagnosed? This condition is most often diagnosed during an exam for other colon problems. Because diverticulosis usually has no symptoms, it often cannot be diagnosed independently. This condition may be diagnosed by:  Using a flexible scope to examine the colon (colonoscopy).  Taking an X-ray of the colon after dye has been put into the colon (barium enema).  Doing a CT scan. How is this treated? You may not need treatment for this condition if you have never developed an infection related to diverticulosis. If you have had an infection before, treatment may include:  Eating a high-fiber diet. This may include eating more fruits, vegetables, and grains.  Taking a fiber supplement.  Taking a live bacteria supplement (probiotic).  Taking medicine to relax your colon.  Taking antibiotic medicines. Follow these instructions at home:  Drink 6-8 glasses of water or more each day to prevent constipation.  Try not to strain when you have a bowel movement.  If you have had an infection before: ? Eat more fiber as directed by your health care provider or your diet and nutrition specialist (dietitian). ? Take a fiber supplement or probiotic, if your health care provider approves.  Take over-the-counter and prescription medicines only as told by your health care provider.  If you were prescribed an antibiotic, take it as told by your health care provider. Do not stop taking the antibiotic even if you start to feel better.  Keep all follow-up visits as told by your health care provider. This is important. Contact a health care provider if:  You have pain in your abdomen.  You have bloating.  You have cramps.  You have not had a bowel movement in 3 days. Get help right away if:  Your pain gets worse.  Your bloating becomes very bad.  You  have a fever or chills, and your symptoms suddenly get worse.  You vomit.  You have bowel movements that are bloody or black.  You have bleeding from your rectum. Summary  Diverticulosis is a condition that develops when small pouches (diverticula) form in the wall of the large intestine (colon).  You may have a few pouches or many of them.  This condition is most often diagnosed during an exam for other colon problems.  If you have had an infection related to diverticulosis, treatment may include increasing the fiber in your diet, taking supplements, or taking medicines. This information is not intended to replace advice given to you by your health care provider. Make sure you discuss any questions you have with your health care provider. Document Released: 04/24/2004 Document Revised: 07/10/2017 Document Reviewed: 06/16/2016 Elsevier Patient Education  [place].  High-Fiber Diet Fiber, also called dietary fiber, is a type of carbohydrate that is found in fruits, vegetables, whole grains, and beans. A high-fiber diet can  have many health benefits. Your health care provider may recommend a high-fiber diet to help:  Prevent constipation. Fiber can make your bowel movements more regular.  Lower your cholesterol.  Relieve the following conditions: ? Swelling of veins in the anus (hemorrhoids). ? Swelling and irritation (inflammation) of specific areas of the digestive tract (uncomplicated diverticulosis). ? A problem of the large intestine (colon) that sometimes causes pain and diarrhea (irritable bowel syndrome, IBS).  Prevent overeating as part of a weight-loss plan.  Prevent heart disease, type 2 diabetes, and certain cancers. What is my plan? The recommended daily fiber intake in grams (g) includes:  38 g for men age 2 or younger.  30 g for men over age 2.  36 g for women age 52 or younger.  21 g for women over age 109. You can get the recommended daily intake  of dietary fiber by:  Eating a variety of fruits, vegetables, grains, and beans.  Taking a fiber supplement, if it is not possible to get enough fiber through your diet. What do I need to know about a high-fiber diet?  It is better to get fiber through food sources rather than from fiber supplements. There is not a lot of research about how effective supplements are.  Always check the fiber content on the nutrition facts label of any prepackaged food. Look for foods that contain 5 g of fiber or more per serving.  Talk with a diet and nutrition specialist (dietitian) if you have questions about specific foods that are recommended or not recommended for your medical condition, especially if those foods are not listed below.  Gradually increase how much fiber you consume. If you increase your intake of dietary fiber too quickly, you may have bloating, cramping, or gas.  Drink plenty of water. Water helps you to digest fiber. What are tips for following this plan?  Eat a wide variety of high-fiber foods.  Make sure that half of the grains that you eat each day are whole grains.  Eat breads and cereals that are made with whole-grain flour instead of refined flour or white flour.  Eat brown rice, bulgur wheat, or millet instead of white rice.  Start the day with a breakfast that is high in fiber, such as a cereal that contains 5 g of fiber or more per serving.  Use beans in place of meat in soups, salads, and pasta dishes.  Eat high-fiber snacks, such as berries, raw vegetables, nuts, and popcorn.  Choose whole fruits and vegetables instead of processed forms like juice or sauce. What foods can I eat?  Fruits Berries. Pears. Apples. Oranges. Avocado. Prunes and raisins. Dried figs. Vegetables Sweet potatoes. Spinach. Kale. Artichokes. Cabbage. Broccoli. Cauliflower. Green peas. Carrots. Squash. Grains Whole-grain breads. Multigrain cereal. Oats and oatmeal. Brown rice. Barley.  Bulgur wheat. [place]. Quinoa. Bran muffins. Popcorn. Rye wafer crackers. Meats and other proteins Navy, kidney, and pinto beans. Soybeans. Split peas. Lentils. Nuts and seeds. Dairy Fiber-fortified yogurt. Beverages Fiber-fortified soy milk. Fiber-fortified orange juice. Other foods Fiber bars. The items listed above may not be a complete list of recommended foods and beverages. Contact a dietitian for more options. What foods are not recommended? Fruits Fruit juice. Cooked, strained fruit. Vegetables Fried potatoes. Canned vegetables. Well-cooked vegetables. Grains White bread. Pasta made with refined flour. White rice. Meats and other proteins Fatty cuts of meat. Fried chicken or fried fish. Dairy Milk. Yogurt. Cream cheese. Sour cream. Fats and oils Butters. Beverages Soft drinks. Other  foods Cakes and pastries. The items listed above may not be a complete list of foods and beverages to avoid. Contact a dietitian for more information. Summary  Fiber is a type of carbohydrate. It is found in fruits, vegetables, whole grains, and beans.  There are many health benefits of eating a high-fiber diet, such as preventing constipation, lowering blood cholesterol, helping with weight loss, and reducing your risk of heart disease, diabetes, and certain cancers.  Gradually increase your intake of fiber. Increasing too fast can result in cramping, bloating, and gas. Drink plenty of water while you increase your fiber.  The best sources of fiber include whole fruits and vegetables, whole grains, nuts, seeds, and beans. This information is not intended to replace advice given to you by your health care provider. Make sure you discuss any questions you have with your health care provider. Document Released: 07/28/2005 Document Revised: 06/01/2017 Document Reviewed: 06/01/2017 Elsevier Patient Education  2020 [employer].

## 2019-06-15 NOTE — H&P (Addendum)
Angela Stein is an 68 y.o. female.   Chief Complaint: Patient is here for diagnostic colonoscopy. HPI: Patient is 68 year old Caucasian female who is here for diagnostic colonoscopy.  She has a history of colonic diverticulosis discovered on her last colonoscopy 5 or 6 years ago.  She was treated for diverticulitis and November 2019 and 3 times this year most recently in July 2020.  She has never required hospitalization.  She developed C. difficile colitis and was treated with vancomycin and Flagyl.  Now she is having normal bowel movements.  He denies melena or rectal bleeding. He also complains of pain under the left rib cage laterally and in the flank.  She has had shingles in this area.  She denies nausea vomiting heartburn dysphagia or epigastric pain. Last Eliquis dose was on 06/12/2019.  Past Medical History:  Diagnosis Date  . Atrial fibrillation ([place])         History of sigmoid diverticulitis.       History of C. difficile colitis.  Past Surgical History:  Procedure Laterality Date  . ABDOMINAL HYSTERECTOMY    . DILATION AND CURETTAGE OF UTERUS    . TONSILLECTOMY      History reviewed. No pertinent family history. Social History:  reports that she has been smoking cigarettes. She has never used smokeless tobacco. She reports current alcohol use. She reports that she does not use drugs.  Allergies:  Allergies  Allergen Reactions  . Ciprofloxacin Hives  . Lisinopril Swelling    Medications Prior to Admission  Medication Sig Dispense Refill  . amLODipine (NORVASC) 5 MG tablet Take 5 mg by mouth daily.    Marland Kitchen apixaban (ELIQUIS) 5 MG TABS tablet Take 5 mg by mouth 2 (two) times daily.     . Calcium Carbonate (CALCIUM 600 PO) Take 500 mg by mouth 2 (two) times daily.     . Cholecalciferol (VITAMIN D3) 25 MCG (1000 UT) CAPS Take 1,000 Units by mouth 2 (two) times daily.     . furosemide (LASIX) 40 MG tablet Take 20 mg by mouth daily.     . metoprolol tartrate (LOPRESSOR) 50 MG  tablet Take 75 mg by mouth 2 (two) times daily.     Marland Kitchen saccharomyces boulardii (FLORASTOR) 250 MG capsule Take 250 mg by mouth daily at 12 noon.     . valACYclovir (VALTREX) 1000 MG tablet Take 1,000 mg by mouth at bedtime.       No results found for this or any previous visit (from the past 48 hour(s)). No results found.  ROS  Blood pressure (!) 153/75, pulse 85, temperature 98.7 F (37.1 C), temperature source Axillary, resp. rate 11, height 5\' 1"  (1.549 m), weight 59 kg, SpO2 100 %. Physical Exam  Constitutional: She appears well-developed and well-nourished.  HENT:  Mouth/Throat: Oropharynx is clear and moist.  Eyes: Conjunctivae are normal. No scleral icterus.  Neck: No thyromegaly present.  Cardiovascular:  Irregular rhythm normal S1 and S2.  No murmur gallop noted.  Respiratory: Effort normal and breath sounds normal.  GI:  Abdomen is symmetrical.  She has very faint rash over left rib cage along midclavicular line.  Abdomen is soft and nontender without organomegaly or masses.  Musculoskeletal:        General: No edema.  Neurological: She is alert.  Skin: Skin is warm and dry.     Assessment/Plan History of recurrent diverticulitis. Left upper quadrant/flank pain appears to be musculoskeletal or neuropathic pain.  She has a history  of shingles involving this dermatome. Diagnostic colonoscopy.  Hildred Laser, MD 06/15/2019, 12:59 PM

## 2019-06-15 NOTE — Op Note (Signed)
[facility] Patient Name: Angela Stein Procedure Date: 06/15/2019 1:05 PM MRN: [id number] Date of Birth: 23-Dec-1950 Attending MD: Hildred Laser , MD CSN: [id number] Age: 68 Admit Type: Outpatient Procedure:                Colonoscopy Indications:              Follow-up of diverticulitis Providers:                Hildred Laser, MD, Hinton Rao, RN, Raphael Gibney, Technician Referring MD:             Mitzie Na. Quillian Quince, MD Medicines:                Meperidine 50 mg IV, Midazolam 5 mg IV Complications:            No immediate complications. Estimated Blood Loss:     Estimated blood loss was minimal. Procedure:                Pre-Anesthesia Assessment:                           - Prior to the procedure, a History and Physical                            was performed, and patient medications and                            allergies were reviewed. The patient's tolerance of                            previous anesthesia was also reviewed. The risks                            and benefits of the procedure and the sedation                            options and risks were discussed with the patient.                            All questions were answered, and informed consent                            was obtained. Prior Anticoagulants: The patient                            last took Eliquis (apixaban) 3 days prior to the                            procedure. ASA Grade Assessment: III - A patient                            with severe systemic disease. After reviewing the  risks and benefits, the patient was deemed in                            satisfactory condition to undergo the procedure.                           After obtaining informed consent, the colonoscope                            was passed under direct vision. Throughout the                            procedure, the patient's blood pressure, pulse, and         oxygen saturations were monitored continuously. The                            [id number]) was introduced through the                            anus and advanced to the the cecum, identified by                            appendiceal orifice and ileocecal valve. The                            colonoscopy was performed without difficulty. The                            patient tolerated the procedure well. The quality                            of the bowel preparation was adequate. The                            ileocecal valve, appendiceal orifice, and rectum                            were photographed. Scope In: 1:12:56 PM Scope Out: 1:30:43 PM Scope Withdrawal Time: 0 hours 8 minutes 10 seconds  Total Procedure Duration: 0 hours 17 minutes 47 seconds  Findings:      The perianal and digital rectal examinations were normal.      Multiple small and large-mouthed diverticula were found in the sigmoid       colon.      Scattered small and large-mouthed diverticula were found in the       descending colon, splenic flexure, transverse colon, hepatic flexure and       ascending colon.      A 5 mm polyp was found in the recto-sigmoid colon. The polyp was       sessile. The polyp was removed with a cold snare. Resection and       retrieval were complete.      External hemorrhoids were found during retroflexion. The hemorrhoids       were small. Impression:               -  Diverticulosis in the sigmoid colon.                           - Diverticulosis in the descending colon, at the                            splenic flexure, in the transverse colon, at the                            hepatic flexure and in the ascending colon.                           - One 5 mm polyp at the recto-sigmoid colon,                            removed with a cold snare. Resected and retrieved.                           - External hemorrhoids. Moderate Sedation:      Moderate (conscious) sedation  was administered by the endoscopy nurse       and supervised by the endoscopist. The following parameters were       monitored: oxygen saturation, heart rate, blood pressure, CO2       capnography and response to care. Total physician intraservice time was       24 minutes. Recommendation:           - Patient has a contact number available for                            emergencies. The signs and symptoms of potential                            delayed complications were discussed with the                            patient. Return to normal activities tomorrow.                            Written discharge instructions were provided to the                            patient.                           - High fiber diet today.                           - Continue present medications.                           - Resume Eliquis (apixaban) at prior dose tomorrow.                           - Await pathology results.                           -  Repeat colonoscopy is recommended. The                            colonoscopy date will be determined after pathology                            results from today's exam become available for                            review. Procedure Code(s):        --- Professional ---                           [phone], Colonoscopy, flexible; with removal of                            tumor(s), polyp(s), or other lesion(s) by snare                            technique                           99153, Moderate sedation; each additional 15                            minutes intraservice time                           G0500, Moderate sedation services provided by the                            same physician or other qualified health care                            professional performing a gastrointestinal                            endoscopic service that sedation supports,                            requiring the presence of an independent trained                             observer to assist in the monitoring of the                            patient's level of consciousness and physiological                            status; initial 15 minutes of intra-service time;                            patient age 70 years or older (additional time may                            be  reported with [phone], as appropriate) Diagnosis Code(s):        --- Professional ---                           K64.4, Residual hemorrhoidal skin tags                           K63.5, Polyp of colon                           K57.32, Diverticulitis of large intestine without                            perforation or abscess without bleeding                           K57.30, Diverticulosis of large intestine without                            perforation or abscess without bleeding CPT copyright 2019 American [employer] Association. All rights reserved. The codes documented in this report are preliminary and upon coder review may  be revised to meet current compliance requirements. Hildred Laser, MD Hildred Laser, MD 06/15/2019 1:41:33 PM This report has been signed electronically. Number of Addenda: 0

## 2019-06-17 LAB — SURGICAL PATHOLOGY

## 2019-06-20 ENCOUNTER — Encounter (HOSPITAL_COMMUNITY): Payer: Self-pay | Admitting: Internal Medicine

## 2019-07-09 DIAGNOSIS — Z6826 Body mass index (BMI) 26.0-26.9, adult: Secondary | ICD-10-CM | POA: Diagnosis not present

## 2019-07-09 DIAGNOSIS — B0229 Other postherpetic nervous system involvement: Secondary | ICD-10-CM | POA: Diagnosis not present

## 2019-07-09 DIAGNOSIS — M545 Low back pain: Secondary | ICD-10-CM | POA: Diagnosis not present

## 2019-07-20 DIAGNOSIS — Z01812 Encounter for preprocedural laboratory examination: Secondary | ICD-10-CM | POA: Diagnosis not present

## 2019-07-20 DIAGNOSIS — Z20828 Contact with and (suspected) exposure to other viral communicable diseases: Secondary | ICD-10-CM | POA: Diagnosis not present

## 2019-07-20 DIAGNOSIS — I4819 Other persistent atrial fibrillation: Secondary | ICD-10-CM | POA: Diagnosis not present

## 2019-07-22 DIAGNOSIS — Z7901 Long term (current) use of anticoagulants: Secondary | ICD-10-CM | POA: Diagnosis not present

## 2019-07-22 DIAGNOSIS — Z20828 Contact with and (suspected) exposure to other viral communicable diseases: Secondary | ICD-10-CM | POA: Diagnosis not present

## 2019-07-22 DIAGNOSIS — I272 Pulmonary hypertension, unspecified: Secondary | ICD-10-CM | POA: Diagnosis not present

## 2019-07-22 DIAGNOSIS — F17210 Nicotine dependence, cigarettes, uncomplicated: Secondary | ICD-10-CM | POA: Diagnosis not present

## 2019-07-22 DIAGNOSIS — M47816 Spondylosis without myelopathy or radiculopathy, lumbar region: Secondary | ICD-10-CM | POA: Diagnosis not present

## 2019-07-22 DIAGNOSIS — I4892 Unspecified atrial flutter: Secondary | ICD-10-CM | POA: Diagnosis not present

## 2019-07-22 DIAGNOSIS — I5032 Chronic diastolic (congestive) heart failure: Secondary | ICD-10-CM | POA: Diagnosis not present

## 2019-07-22 DIAGNOSIS — I5030 Unspecified diastolic (congestive) heart failure: Secondary | ICD-10-CM | POA: Diagnosis not present

## 2019-07-22 DIAGNOSIS — I4819 Other persistent atrial fibrillation: Secondary | ICD-10-CM | POA: Diagnosis not present

## 2019-07-22 DIAGNOSIS — I110 Hypertensive heart disease with heart failure: Secondary | ICD-10-CM | POA: Diagnosis not present

## 2019-08-24 DIAGNOSIS — Z6825 Body mass index (BMI) 25.0-25.9, adult: Secondary | ICD-10-CM | POA: Diagnosis not present

## 2019-08-24 DIAGNOSIS — I4891 Unspecified atrial fibrillation: Secondary | ICD-10-CM | POA: Diagnosis not present

## 2019-08-25 DIAGNOSIS — I5032 Chronic diastolic (congestive) heart failure: Secondary | ICD-10-CM | POA: Diagnosis not present

## 2019-08-25 DIAGNOSIS — I10 Essential (primary) hypertension: Secondary | ICD-10-CM | POA: Diagnosis not present

## 2019-08-25 DIAGNOSIS — I4811 Longstanding persistent atrial fibrillation: Secondary | ICD-10-CM | POA: Diagnosis not present

## 2019-09-01 DIAGNOSIS — I10 Essential (primary) hypertension: Secondary | ICD-10-CM | POA: Diagnosis not present

## 2019-09-06 DIAGNOSIS — Z1231 Encounter for screening mammogram for malignant neoplasm of breast: Secondary | ICD-10-CM | POA: Diagnosis not present

## 2019-09-12 DIAGNOSIS — G629 Polyneuropathy, unspecified: Secondary | ICD-10-CM | POA: Diagnosis not present

## 2019-09-12 DIAGNOSIS — B029 Zoster without complications: Secondary | ICD-10-CM | POA: Diagnosis not present

## 2019-09-12 DIAGNOSIS — Z79899 Other long term (current) drug therapy: Secondary | ICD-10-CM | POA: Diagnosis not present

## 2019-09-12 DIAGNOSIS — I10 Essential (primary) hypertension: Secondary | ICD-10-CM | POA: Diagnosis not present

## 2019-09-12 DIAGNOSIS — Z7901 Long term (current) use of anticoagulants: Secondary | ICD-10-CM | POA: Diagnosis not present

## 2019-09-12 DIAGNOSIS — I4819 Other persistent atrial fibrillation: Secondary | ICD-10-CM | POA: Diagnosis not present

## 2019-10-26 DIAGNOSIS — E559 Vitamin D deficiency, unspecified: Secondary | ICD-10-CM | POA: Diagnosis not present

## 2019-10-26 DIAGNOSIS — M8580 Other specified disorders of bone density and structure, unspecified site: Secondary | ICD-10-CM | POA: Diagnosis not present

## 2019-10-26 DIAGNOSIS — D6859 Other primary thrombophilia: Secondary | ICD-10-CM | POA: Diagnosis not present

## 2019-10-31 DIAGNOSIS — M8580 Other specified disorders of bone density and structure, unspecified site: Secondary | ICD-10-CM | POA: Diagnosis not present

## 2019-10-31 DIAGNOSIS — I4819 Other persistent atrial fibrillation: Secondary | ICD-10-CM | POA: Diagnosis not present

## 2019-10-31 DIAGNOSIS — I4811 Longstanding persistent atrial fibrillation: Secondary | ICD-10-CM | POA: Diagnosis not present

## 2019-10-31 DIAGNOSIS — I5032 Chronic diastolic (congestive) heart failure: Secondary | ICD-10-CM | POA: Diagnosis not present

## 2019-10-31 DIAGNOSIS — D6859 Other primary thrombophilia: Secondary | ICD-10-CM | POA: Diagnosis not present

## 2019-10-31 DIAGNOSIS — Z23 Encounter for immunization: Secondary | ICD-10-CM | POA: Diagnosis not present

## 2019-11-03 DIAGNOSIS — R2990 Unspecified symptoms and signs involving the nervous system: Secondary | ICD-10-CM | POA: Diagnosis not present

## 2019-11-03 DIAGNOSIS — G8929 Other chronic pain: Secondary | ICD-10-CM | POA: Diagnosis not present

## 2019-11-03 DIAGNOSIS — M5441 Lumbago with sciatica, right side: Secondary | ICD-10-CM | POA: Diagnosis not present

## 2019-11-03 DIAGNOSIS — M5442 Lumbago with sciatica, left side: Secondary | ICD-10-CM | POA: Diagnosis not present

## 2019-11-24 DIAGNOSIS — I10 Essential (primary) hypertension: Secondary | ICD-10-CM | POA: Diagnosis not present

## 2019-11-24 DIAGNOSIS — I4811 Longstanding persistent atrial fibrillation: Secondary | ICD-10-CM | POA: Diagnosis not present

## 2019-11-24 DIAGNOSIS — I5032 Chronic diastolic (congestive) heart failure: Secondary | ICD-10-CM | POA: Diagnosis not present

## 2019-11-29 DIAGNOSIS — M5441 Lumbago with sciatica, right side: Secondary | ICD-10-CM | POA: Diagnosis not present

## 2019-11-29 DIAGNOSIS — M5442 Lumbago with sciatica, left side: Secondary | ICD-10-CM | POA: Diagnosis not present

## 2019-11-29 DIAGNOSIS — G8929 Other chronic pain: Secondary | ICD-10-CM | POA: Diagnosis not present

## 2019-11-29 DIAGNOSIS — S3210XA Unspecified fracture of sacrum, initial encounter for closed fracture: Secondary | ICD-10-CM | POA: Diagnosis not present

## 2019-11-29 DIAGNOSIS — M48061 Spinal stenosis, lumbar region without neurogenic claudication: Secondary | ICD-10-CM | POA: Diagnosis not present

## 2019-11-29 DIAGNOSIS — M545 Low back pain: Secondary | ICD-10-CM | POA: Diagnosis not present

## 2019-11-29 DIAGNOSIS — R2990 Unspecified symptoms and signs involving the nervous system: Secondary | ICD-10-CM | POA: Diagnosis not present

## 2019-11-29 DIAGNOSIS — M5126 Other intervertebral disc displacement, lumbar region: Secondary | ICD-10-CM | POA: Diagnosis not present

## 2020-02-01 DIAGNOSIS — S3210XS Unspecified fracture of sacrum, sequela: Secondary | ICD-10-CM | POA: Diagnosis not present

## 2020-02-01 DIAGNOSIS — M76891 Other specified enthesopathies of right lower limb, excluding foot: Secondary | ICD-10-CM | POA: Diagnosis not present

## 2020-02-01 DIAGNOSIS — M533 Sacrococcygeal disorders, not elsewhere classified: Secondary | ICD-10-CM | POA: Diagnosis not present

## 2020-02-01 DIAGNOSIS — M76892 Other specified enthesopathies of left lower limb, excluding foot: Secondary | ICD-10-CM | POA: Diagnosis not present

## 2020-02-16 DIAGNOSIS — M5441 Lumbago with sciatica, right side: Secondary | ICD-10-CM | POA: Diagnosis not present

## 2020-02-16 DIAGNOSIS — M5416 Radiculopathy, lumbar region: Secondary | ICD-10-CM | POA: Diagnosis not present

## 2020-02-16 DIAGNOSIS — G8929 Other chronic pain: Secondary | ICD-10-CM | POA: Diagnosis not present

## 2020-02-16 DIAGNOSIS — M5442 Lumbago with sciatica, left side: Secondary | ICD-10-CM | POA: Diagnosis not present

## 2020-04-09 DIAGNOSIS — R202 Paresthesia of skin: Secondary | ICD-10-CM | POA: Diagnosis not present

## 2020-04-09 DIAGNOSIS — Z5181 Encounter for therapeutic drug level monitoring: Secondary | ICD-10-CM | POA: Diagnosis not present

## 2020-04-09 DIAGNOSIS — Z79899 Other long term (current) drug therapy: Secondary | ICD-10-CM | POA: Diagnosis not present

## 2020-04-09 DIAGNOSIS — I4819 Other persistent atrial fibrillation: Secondary | ICD-10-CM | POA: Diagnosis not present

## 2020-04-25 DIAGNOSIS — I4819 Other persistent atrial fibrillation: Secondary | ICD-10-CM | POA: Diagnosis not present

## 2020-04-25 DIAGNOSIS — M8580 Other specified disorders of bone density and structure, unspecified site: Secondary | ICD-10-CM | POA: Diagnosis not present

## 2020-04-25 DIAGNOSIS — R300 Dysuria: Secondary | ICD-10-CM | POA: Diagnosis not present

## 2020-04-25 DIAGNOSIS — D6859 Other primary thrombophilia: Secondary | ICD-10-CM | POA: Diagnosis not present

## 2020-04-25 DIAGNOSIS — K5732 Diverticulitis of large intestine without perforation or abscess without bleeding: Secondary | ICD-10-CM | POA: Diagnosis not present

## 2020-04-26 DIAGNOSIS — M533 Sacrococcygeal disorders, not elsewhere classified: Secondary | ICD-10-CM | POA: Diagnosis not present

## 2020-04-26 DIAGNOSIS — G8929 Other chronic pain: Secondary | ICD-10-CM | POA: Diagnosis not present

## 2020-04-26 DIAGNOSIS — G5700 Lesion of sciatic nerve, unspecified lower limb: Secondary | ICD-10-CM | POA: Diagnosis not present

## 2020-04-26 DIAGNOSIS — G589 Mononeuropathy, unspecified: Secondary | ICD-10-CM | POA: Diagnosis not present

## 2020-04-27 DIAGNOSIS — G5700 Lesion of sciatic nerve, unspecified lower limb: Secondary | ICD-10-CM | POA: Diagnosis not present

## 2020-05-01 DIAGNOSIS — G5731 Lesion of lateral popliteal nerve, right lower limb: Secondary | ICD-10-CM | POA: Diagnosis not present

## 2020-05-01 DIAGNOSIS — R202 Paresthesia of skin: Secondary | ICD-10-CM | POA: Diagnosis not present

## 2020-05-02 DIAGNOSIS — Z7901 Long term (current) use of anticoagulants: Secondary | ICD-10-CM | POA: Diagnosis not present

## 2020-05-02 DIAGNOSIS — M810 Age-related osteoporosis without current pathological fracture: Secondary | ICD-10-CM | POA: Diagnosis not present

## 2020-05-02 DIAGNOSIS — M48062 Spinal stenosis, lumbar region with neurogenic claudication: Secondary | ICD-10-CM | POA: Diagnosis not present

## 2020-05-02 DIAGNOSIS — Z23 Encounter for immunization: Secondary | ICD-10-CM | POA: Diagnosis not present

## 2020-05-02 DIAGNOSIS — I4819 Other persistent atrial fibrillation: Secondary | ICD-10-CM | POA: Diagnosis not present

## 2020-05-02 DIAGNOSIS — D6859 Other primary thrombophilia: Secondary | ICD-10-CM | POA: Diagnosis not present

## 2020-05-09 DIAGNOSIS — M8580 Other specified disorders of bone density and structure, unspecified site: Secondary | ICD-10-CM | POA: Diagnosis not present

## 2020-05-21 DIAGNOSIS — I10 Essential (primary) hypertension: Secondary | ICD-10-CM | POA: Diagnosis not present

## 2020-05-21 DIAGNOSIS — G589 Mononeuropathy, unspecified: Secondary | ICD-10-CM | POA: Diagnosis not present

## 2020-05-21 DIAGNOSIS — M25561 Pain in right knee: Secondary | ICD-10-CM | POA: Diagnosis not present

## 2020-05-21 DIAGNOSIS — I2510 Atherosclerotic heart disease of native coronary artery without angina pectoris: Secondary | ICD-10-CM | POA: Diagnosis not present

## 2020-05-21 DIAGNOSIS — I4811 Longstanding persistent atrial fibrillation: Secondary | ICD-10-CM | POA: Diagnosis not present

## 2020-05-21 DIAGNOSIS — I5032 Chronic diastolic (congestive) heart failure: Secondary | ICD-10-CM | POA: Diagnosis not present

## 2020-06-13 DIAGNOSIS — Z23 Encounter for immunization: Secondary | ICD-10-CM | POA: Diagnosis not present

## 2020-08-07 DIAGNOSIS — I4891 Unspecified atrial fibrillation: Secondary | ICD-10-CM | POA: Diagnosis not present

## 2020-08-07 DIAGNOSIS — F17210 Nicotine dependence, cigarettes, uncomplicated: Secondary | ICD-10-CM | POA: Diagnosis not present

## 2020-08-07 DIAGNOSIS — E782 Mixed hyperlipidemia: Secondary | ICD-10-CM | POA: Diagnosis not present

## 2020-08-07 DIAGNOSIS — Z1212 Encounter for screening for malignant neoplasm of rectum: Secondary | ICD-10-CM | POA: Diagnosis not present

## 2020-08-07 DIAGNOSIS — I5032 Chronic diastolic (congestive) heart failure: Secondary | ICD-10-CM | POA: Diagnosis not present

## 2020-08-07 DIAGNOSIS — M8580 Other specified disorders of bone density and structure, unspecified site: Secondary | ICD-10-CM | POA: Diagnosis not present

## 2020-08-07 DIAGNOSIS — Z0001 Encounter for general adult medical examination with abnormal findings: Secondary | ICD-10-CM | POA: Diagnosis not present

## 2020-08-07 DIAGNOSIS — Z6829 Body mass index (BMI) 29.0-29.9, adult: Secondary | ICD-10-CM | POA: Diagnosis not present

## 2020-08-07 DIAGNOSIS — I10 Essential (primary) hypertension: Secondary | ICD-10-CM | POA: Diagnosis not present

## 2020-10-24 DIAGNOSIS — M810 Age-related osteoporosis without current pathological fracture: Secondary | ICD-10-CM | POA: Diagnosis not present

## 2020-10-24 DIAGNOSIS — R300 Dysuria: Secondary | ICD-10-CM | POA: Diagnosis not present

## 2020-10-24 DIAGNOSIS — M8580 Other specified disorders of bone density and structure, unspecified site: Secondary | ICD-10-CM | POA: Diagnosis not present

## 2020-10-24 DIAGNOSIS — D6859 Other primary thrombophilia: Secondary | ICD-10-CM | POA: Diagnosis not present

## 2020-10-24 DIAGNOSIS — Z780 Asymptomatic menopausal state: Secondary | ICD-10-CM | POA: Diagnosis not present

## 2020-10-26 DIAGNOSIS — Z6829 Body mass index (BMI) 29.0-29.9, adult: Secondary | ICD-10-CM | POA: Diagnosis not present

## 2020-10-26 DIAGNOSIS — J329 Chronic sinusitis, unspecified: Secondary | ICD-10-CM | POA: Diagnosis not present

## 2020-11-01 DIAGNOSIS — M48062 Spinal stenosis, lumbar region with neurogenic claudication: Secondary | ICD-10-CM | POA: Diagnosis not present

## 2020-11-01 DIAGNOSIS — D6859 Other primary thrombophilia: Secondary | ICD-10-CM | POA: Diagnosis not present

## 2020-11-01 DIAGNOSIS — I5032 Chronic diastolic (congestive) heart failure: Secondary | ICD-10-CM | POA: Diagnosis not present

## 2020-11-01 DIAGNOSIS — I4811 Longstanding persistent atrial fibrillation: Secondary | ICD-10-CM | POA: Diagnosis not present

## 2020-11-01 DIAGNOSIS — M810 Age-related osteoporosis without current pathological fracture: Secondary | ICD-10-CM | POA: Diagnosis not present

## 2020-11-01 DIAGNOSIS — Z7901 Long term (current) use of anticoagulants: Secondary | ICD-10-CM | POA: Diagnosis not present

## 2020-11-05 DIAGNOSIS — Z7983 Long term (current) use of bisphosphonates: Secondary | ICD-10-CM | POA: Diagnosis not present

## 2020-11-05 DIAGNOSIS — F17200 Nicotine dependence, unspecified, uncomplicated: Secondary | ICD-10-CM | POA: Diagnosis not present

## 2020-11-05 DIAGNOSIS — G629 Polyneuropathy, unspecified: Secondary | ICD-10-CM | POA: Diagnosis not present

## 2020-11-05 DIAGNOSIS — I4811 Longstanding persistent atrial fibrillation: Secondary | ICD-10-CM | POA: Diagnosis not present

## 2020-11-05 DIAGNOSIS — I2510 Atherosclerotic heart disease of native coronary artery without angina pectoris: Secondary | ICD-10-CM | POA: Diagnosis not present

## 2020-11-05 DIAGNOSIS — B029 Zoster without complications: Secondary | ICD-10-CM | POA: Diagnosis not present

## 2020-11-05 DIAGNOSIS — I5032 Chronic diastolic (congestive) heart failure: Secondary | ICD-10-CM | POA: Diagnosis not present

## 2020-11-05 DIAGNOSIS — Z7901 Long term (current) use of anticoagulants: Secondary | ICD-10-CM | POA: Diagnosis not present

## 2020-11-05 DIAGNOSIS — I110 Hypertensive heart disease with heart failure: Secondary | ICD-10-CM | POA: Diagnosis not present

## 2020-11-08 DIAGNOSIS — M5441 Lumbago with sciatica, right side: Secondary | ICD-10-CM | POA: Diagnosis not present

## 2020-11-08 DIAGNOSIS — G5701 Lesion of sciatic nerve, right lower limb: Secondary | ICD-10-CM | POA: Diagnosis not present

## 2020-11-08 DIAGNOSIS — M5442 Lumbago with sciatica, left side: Secondary | ICD-10-CM | POA: Diagnosis not present

## 2020-11-12 DIAGNOSIS — M85832 Other specified disorders of bone density and structure, left forearm: Secondary | ICD-10-CM | POA: Diagnosis not present

## 2020-11-12 DIAGNOSIS — M810 Age-related osteoporosis without current pathological fracture: Secondary | ICD-10-CM | POA: Diagnosis not present

## 2020-11-12 DIAGNOSIS — Z780 Asymptomatic menopausal state: Secondary | ICD-10-CM | POA: Diagnosis not present

## 2020-12-15 DIAGNOSIS — Z23 Encounter for immunization: Secondary | ICD-10-CM | POA: Diagnosis not present

## 2020-12-26 DIAGNOSIS — M5440 Lumbago with sciatica, unspecified side: Secondary | ICD-10-CM | POA: Diagnosis not present

## 2020-12-26 DIAGNOSIS — G8929 Other chronic pain: Secondary | ICD-10-CM | POA: Diagnosis not present

## 2021-01-10 DIAGNOSIS — J441 Chronic obstructive pulmonary disease with (acute) exacerbation: Secondary | ICD-10-CM | POA: Diagnosis not present

## 2021-01-10 DIAGNOSIS — Z20828 Contact with and (suspected) exposure to other viral communicable diseases: Secondary | ICD-10-CM | POA: Diagnosis not present

## 2021-01-14 DIAGNOSIS — I5032 Chronic diastolic (congestive) heart failure: Secondary | ICD-10-CM | POA: Diagnosis not present

## 2021-01-14 DIAGNOSIS — I4811 Longstanding persistent atrial fibrillation: Secondary | ICD-10-CM | POA: Diagnosis not present

## 2021-01-14 DIAGNOSIS — I2510 Atherosclerotic heart disease of native coronary artery without angina pectoris: Secondary | ICD-10-CM | POA: Diagnosis not present

## 2021-02-05 DIAGNOSIS — J209 Acute bronchitis, unspecified: Secondary | ICD-10-CM | POA: Diagnosis not present

## 2021-02-05 DIAGNOSIS — Z20828 Contact with and (suspected) exposure to other viral communicable diseases: Secondary | ICD-10-CM | POA: Diagnosis not present

## 2021-02-15 DIAGNOSIS — J441 Chronic obstructive pulmonary disease with (acute) exacerbation: Secondary | ICD-10-CM | POA: Diagnosis not present

## 2021-04-13 DIAGNOSIS — Z20828 Contact with and (suspected) exposure to other viral communicable diseases: Secondary | ICD-10-CM | POA: Diagnosis not present

## 2021-04-25 DIAGNOSIS — R300 Dysuria: Secondary | ICD-10-CM | POA: Diagnosis not present

## 2021-04-25 DIAGNOSIS — M810 Age-related osteoporosis without current pathological fracture: Secondary | ICD-10-CM | POA: Diagnosis not present

## 2021-04-25 DIAGNOSIS — Z780 Asymptomatic menopausal state: Secondary | ICD-10-CM | POA: Diagnosis not present

## 2021-04-25 DIAGNOSIS — D6859 Other primary thrombophilia: Secondary | ICD-10-CM | POA: Diagnosis not present

## 2021-04-25 DIAGNOSIS — M8580 Other specified disorders of bone density and structure, unspecified site: Secondary | ICD-10-CM | POA: Diagnosis not present

## 2021-04-28 DIAGNOSIS — A0472 Enterocolitis due to Clostridium difficile, not specified as recurrent: Secondary | ICD-10-CM | POA: Diagnosis not present

## 2021-05-03 DIAGNOSIS — Z6829 Body mass index (BMI) 29.0-29.9, adult: Secondary | ICD-10-CM | POA: Diagnosis not present

## 2021-05-03 DIAGNOSIS — I10 Essential (primary) hypertension: Secondary | ICD-10-CM | POA: Diagnosis not present

## 2021-05-03 DIAGNOSIS — A0472 Enterocolitis due to Clostridium difficile, not specified as recurrent: Secondary | ICD-10-CM | POA: Diagnosis not present

## 2021-05-21 DIAGNOSIS — Z780 Asymptomatic menopausal state: Secondary | ICD-10-CM | POA: Diagnosis not present

## 2021-05-21 DIAGNOSIS — M8580 Other specified disorders of bone density and structure, unspecified site: Secondary | ICD-10-CM | POA: Diagnosis not present

## 2021-06-03 DIAGNOSIS — I10 Essential (primary) hypertension: Secondary | ICD-10-CM | POA: Diagnosis not present

## 2021-06-03 DIAGNOSIS — Z5181 Encounter for therapeutic drug level monitoring: Secondary | ICD-10-CM | POA: Diagnosis not present

## 2021-06-03 DIAGNOSIS — Z79899 Other long term (current) drug therapy: Secondary | ICD-10-CM | POA: Diagnosis not present

## 2021-06-03 DIAGNOSIS — I4811 Longstanding persistent atrial fibrillation: Secondary | ICD-10-CM | POA: Diagnosis not present

## 2021-06-10 DIAGNOSIS — Z79899 Other long term (current) drug therapy: Secondary | ICD-10-CM | POA: Diagnosis not present

## 2021-06-10 DIAGNOSIS — G8929 Other chronic pain: Secondary | ICD-10-CM | POA: Diagnosis not present

## 2021-06-10 DIAGNOSIS — M5442 Lumbago with sciatica, left side: Secondary | ICD-10-CM | POA: Diagnosis not present

## 2021-06-10 DIAGNOSIS — Z7901 Long term (current) use of anticoagulants: Secondary | ICD-10-CM | POA: Diagnosis not present

## 2021-06-10 DIAGNOSIS — G5701 Lesion of sciatic nerve, right lower limb: Secondary | ICD-10-CM | POA: Diagnosis not present

## 2021-06-10 DIAGNOSIS — M5441 Lumbago with sciatica, right side: Secondary | ICD-10-CM | POA: Diagnosis not present

## 2021-06-10 DIAGNOSIS — M9973 Connective tissue and disc stenosis of intervertebral foramina of lumbar region: Secondary | ICD-10-CM | POA: Diagnosis not present

## 2021-06-14 DIAGNOSIS — I4811 Longstanding persistent atrial fibrillation: Secondary | ICD-10-CM | POA: Diagnosis not present

## 2021-07-01 DIAGNOSIS — R197 Diarrhea, unspecified: Secondary | ICD-10-CM | POA: Diagnosis not present

## 2021-07-09 DIAGNOSIS — Z23 Encounter for immunization: Secondary | ICD-10-CM | POA: Diagnosis not present

## 2021-07-16 DIAGNOSIS — I4811 Longstanding persistent atrial fibrillation: Secondary | ICD-10-CM | POA: Diagnosis not present

## 2021-07-16 DIAGNOSIS — I10 Essential (primary) hypertension: Secondary | ICD-10-CM | POA: Diagnosis not present

## 2021-07-16 DIAGNOSIS — I5032 Chronic diastolic (congestive) heart failure: Secondary | ICD-10-CM | POA: Diagnosis not present

## 2021-07-26 DIAGNOSIS — R300 Dysuria: Secondary | ICD-10-CM | POA: Diagnosis not present

## 2021-07-31 DIAGNOSIS — R197 Diarrhea, unspecified: Secondary | ICD-10-CM | POA: Diagnosis not present

## 2021-08-01 DIAGNOSIS — M5410 Radiculopathy, site unspecified: Secondary | ICD-10-CM | POA: Diagnosis not present

## 2021-08-05 DIAGNOSIS — F17210 Nicotine dependence, cigarettes, uncomplicated: Secondary | ICD-10-CM | POA: Diagnosis not present

## 2021-08-05 DIAGNOSIS — I10 Essential (primary) hypertension: Secondary | ICD-10-CM | POA: Diagnosis not present

## 2021-08-05 DIAGNOSIS — I5032 Chronic diastolic (congestive) heart failure: Secondary | ICD-10-CM | POA: Diagnosis not present

## 2021-08-05 DIAGNOSIS — E782 Mixed hyperlipidemia: Secondary | ICD-10-CM | POA: Diagnosis not present

## 2021-08-05 DIAGNOSIS — I4891 Unspecified atrial fibrillation: Secondary | ICD-10-CM | POA: Diagnosis not present

## 2021-08-05 DIAGNOSIS — E559 Vitamin D deficiency, unspecified: Secondary | ICD-10-CM | POA: Diagnosis not present

## 2021-08-05 DIAGNOSIS — E7849 Other hyperlipidemia: Secondary | ICD-10-CM | POA: Diagnosis not present

## 2021-08-05 DIAGNOSIS — Z1329 Encounter for screening for other suspected endocrine disorder: Secondary | ICD-10-CM | POA: Diagnosis not present

## 2021-08-06 DIAGNOSIS — M5410 Radiculopathy, site unspecified: Secondary | ICD-10-CM | POA: Diagnosis not present

## 2021-08-06 DIAGNOSIS — M9953 Intervertebral disc stenosis of neural canal of lumbar region: Secondary | ICD-10-CM | POA: Diagnosis not present

## 2021-08-06 DIAGNOSIS — M47816 Spondylosis without myelopathy or radiculopathy, lumbar region: Secondary | ICD-10-CM | POA: Diagnosis not present

## 2021-08-06 DIAGNOSIS — M48061 Spinal stenosis, lumbar region without neurogenic claudication: Secondary | ICD-10-CM | POA: Diagnosis not present

## 2021-08-06 DIAGNOSIS — M4699 Unspecified inflammatory spondylopathy, multiple sites in spine: Secondary | ICD-10-CM | POA: Diagnosis not present

## 2021-08-06 DIAGNOSIS — M5126 Other intervertebral disc displacement, lumbar region: Secondary | ICD-10-CM | POA: Diagnosis not present

## 2021-08-19 DIAGNOSIS — I4891 Unspecified atrial fibrillation: Secondary | ICD-10-CM | POA: Diagnosis not present

## 2021-08-19 DIAGNOSIS — I5032 Chronic diastolic (congestive) heart failure: Secondary | ICD-10-CM | POA: Diagnosis not present

## 2021-08-19 DIAGNOSIS — I10 Essential (primary) hypertension: Secondary | ICD-10-CM | POA: Diagnosis not present

## 2021-08-19 DIAGNOSIS — Z0001 Encounter for general adult medical examination with abnormal findings: Secondary | ICD-10-CM | POA: Diagnosis not present

## 2021-08-19 DIAGNOSIS — E7849 Other hyperlipidemia: Secondary | ICD-10-CM | POA: Diagnosis not present

## 2021-08-19 DIAGNOSIS — Z1331 Encounter for screening for depression: Secondary | ICD-10-CM | POA: Diagnosis not present

## 2021-08-19 DIAGNOSIS — Z1389 Encounter for screening for other disorder: Secondary | ICD-10-CM | POA: Diagnosis not present

## 2021-08-19 DIAGNOSIS — D692 Other nonthrombocytopenic purpura: Secondary | ICD-10-CM | POA: Diagnosis not present

## 2021-08-27 DIAGNOSIS — Z87891 Personal history of nicotine dependence: Secondary | ICD-10-CM | POA: Diagnosis not present

## 2021-08-27 DIAGNOSIS — M4185 Other forms of scoliosis, thoracolumbar region: Secondary | ICD-10-CM | POA: Diagnosis not present

## 2021-08-27 DIAGNOSIS — Z888 Allergy status to other drugs, medicaments and biological substances status: Secondary | ICD-10-CM | POA: Diagnosis not present

## 2021-08-27 DIAGNOSIS — M5410 Radiculopathy, site unspecified: Secondary | ICD-10-CM | POA: Diagnosis not present

## 2021-08-27 DIAGNOSIS — I110 Hypertensive heart disease with heart failure: Secondary | ICD-10-CM | POA: Diagnosis not present

## 2021-08-27 DIAGNOSIS — I2510 Atherosclerotic heart disease of native coronary artery without angina pectoris: Secondary | ICD-10-CM | POA: Diagnosis not present

## 2021-08-27 DIAGNOSIS — M4726 Other spondylosis with radiculopathy, lumbar region: Secondary | ICD-10-CM | POA: Diagnosis not present

## 2021-08-27 DIAGNOSIS — M5416 Radiculopathy, lumbar region: Secondary | ICD-10-CM | POA: Diagnosis not present

## 2021-08-27 DIAGNOSIS — I509 Heart failure, unspecified: Secondary | ICD-10-CM | POA: Diagnosis not present

## 2021-08-27 DIAGNOSIS — Z881 Allergy status to other antibiotic agents status: Secondary | ICD-10-CM | POA: Diagnosis not present

## 2021-08-27 DIAGNOSIS — M25559 Pain in unspecified hip: Secondary | ICD-10-CM | POA: Diagnosis not present

## 2021-09-02 DIAGNOSIS — R197 Diarrhea, unspecified: Secondary | ICD-10-CM | POA: Diagnosis not present

## 2021-09-04 DIAGNOSIS — A0471 Enterocolitis due to Clostridium difficile, recurrent: Secondary | ICD-10-CM | POA: Diagnosis not present

## 2021-09-09 DIAGNOSIS — F17210 Nicotine dependence, cigarettes, uncomplicated: Secondary | ICD-10-CM | POA: Diagnosis not present

## 2021-09-09 DIAGNOSIS — Z87891 Personal history of nicotine dependence: Secondary | ICD-10-CM | POA: Diagnosis not present

## 2021-09-16 DIAGNOSIS — J439 Emphysema, unspecified: Secondary | ICD-10-CM | POA: Diagnosis not present

## 2021-09-16 DIAGNOSIS — I700 Atherosclerosis of aorta: Secondary | ICD-10-CM | POA: Diagnosis not present

## 2021-09-16 DIAGNOSIS — R918 Other nonspecific abnormal finding of lung field: Secondary | ICD-10-CM | POA: Diagnosis not present

## 2021-09-16 DIAGNOSIS — R911 Solitary pulmonary nodule: Secondary | ICD-10-CM | POA: Diagnosis not present

## 2021-10-02 DIAGNOSIS — M5417 Radiculopathy, lumbosacral region: Secondary | ICD-10-CM | POA: Diagnosis not present

## 2021-10-02 DIAGNOSIS — M5416 Radiculopathy, lumbar region: Secondary | ICD-10-CM | POA: Diagnosis not present

## 2021-10-02 DIAGNOSIS — M48061 Spinal stenosis, lumbar region without neurogenic claudication: Secondary | ICD-10-CM | POA: Diagnosis not present

## 2021-10-02 DIAGNOSIS — M4808 Spinal stenosis, sacral and sacrococcygeal region: Secondary | ICD-10-CM | POA: Diagnosis not present

## 2021-10-10 DIAGNOSIS — M5416 Radiculopathy, lumbar region: Secondary | ICD-10-CM | POA: Diagnosis not present

## 2021-10-22 DIAGNOSIS — Z1231 Encounter for screening mammogram for malignant neoplasm of breast: Secondary | ICD-10-CM | POA: Diagnosis not present

## 2021-10-25 DIAGNOSIS — M5416 Radiculopathy, lumbar region: Secondary | ICD-10-CM | POA: Diagnosis not present

## 2021-11-01 DIAGNOSIS — K6389 Other specified diseases of intestine: Secondary | ICD-10-CM | POA: Diagnosis not present

## 2021-11-01 DIAGNOSIS — G629 Polyneuropathy, unspecified: Secondary | ICD-10-CM | POA: Diagnosis not present

## 2021-11-01 DIAGNOSIS — I110 Hypertensive heart disease with heart failure: Secondary | ICD-10-CM | POA: Diagnosis not present

## 2021-11-01 DIAGNOSIS — K5730 Diverticulosis of large intestine without perforation or abscess without bleeding: Secondary | ICD-10-CM | POA: Diagnosis not present

## 2021-11-01 DIAGNOSIS — A0471 Enterocolitis due to Clostridium difficile, recurrent: Secondary | ICD-10-CM | POA: Diagnosis not present

## 2021-11-01 DIAGNOSIS — I4891 Unspecified atrial fibrillation: Secondary | ICD-10-CM | POA: Diagnosis not present

## 2021-11-01 DIAGNOSIS — K529 Noninfective gastroenteritis and colitis, unspecified: Secondary | ICD-10-CM | POA: Diagnosis not present

## 2021-11-01 DIAGNOSIS — E669 Obesity, unspecified: Secondary | ICD-10-CM | POA: Diagnosis not present

## 2021-11-01 DIAGNOSIS — I2510 Atherosclerotic heart disease of native coronary artery without angina pectoris: Secondary | ICD-10-CM | POA: Diagnosis not present

## 2021-11-01 DIAGNOSIS — I509 Heart failure, unspecified: Secondary | ICD-10-CM | POA: Diagnosis not present

## 2021-12-05 DIAGNOSIS — M5416 Radiculopathy, lumbar region: Secondary | ICD-10-CM | POA: Diagnosis not present

## 2021-12-05 DIAGNOSIS — I4811 Longstanding persistent atrial fibrillation: Secondary | ICD-10-CM | POA: Diagnosis not present

## 2022-01-04 DIAGNOSIS — M5117 Intervertebral disc disorders with radiculopathy, lumbosacral region: Secondary | ICD-10-CM | POA: Diagnosis not present

## 2022-01-14 DIAGNOSIS — I10 Essential (primary) hypertension: Secondary | ICD-10-CM | POA: Diagnosis not present

## 2022-01-14 DIAGNOSIS — I4819 Other persistent atrial fibrillation: Secondary | ICD-10-CM | POA: Diagnosis not present

## 2022-01-14 DIAGNOSIS — I5032 Chronic diastolic (congestive) heart failure: Secondary | ICD-10-CM | POA: Diagnosis not present

## 2022-02-10 DIAGNOSIS — E7849 Other hyperlipidemia: Secondary | ICD-10-CM | POA: Diagnosis not present

## 2022-02-10 DIAGNOSIS — R911 Solitary pulmonary nodule: Secondary | ICD-10-CM | POA: Diagnosis not present

## 2022-02-10 DIAGNOSIS — F17210 Nicotine dependence, cigarettes, uncomplicated: Secondary | ICD-10-CM | POA: Diagnosis not present

## 2022-02-10 DIAGNOSIS — J449 Chronic obstructive pulmonary disease, unspecified: Secondary | ICD-10-CM | POA: Diagnosis not present

## 2022-02-14 DIAGNOSIS — I10 Essential (primary) hypertension: Secondary | ICD-10-CM | POA: Diagnosis not present

## 2022-02-14 DIAGNOSIS — D692 Other nonthrombocytopenic purpura: Secondary | ICD-10-CM | POA: Diagnosis not present

## 2022-02-14 DIAGNOSIS — I5032 Chronic diastolic (congestive) heart failure: Secondary | ICD-10-CM | POA: Diagnosis not present

## 2022-02-14 DIAGNOSIS — J449 Chronic obstructive pulmonary disease, unspecified: Secondary | ICD-10-CM | POA: Diagnosis not present

## 2022-02-14 DIAGNOSIS — D6869 Other thrombophilia: Secondary | ICD-10-CM | POA: Diagnosis not present

## 2022-02-14 DIAGNOSIS — E7849 Other hyperlipidemia: Secondary | ICD-10-CM | POA: Diagnosis not present

## 2022-02-14 DIAGNOSIS — F17210 Nicotine dependence, cigarettes, uncomplicated: Secondary | ICD-10-CM | POA: Diagnosis not present

## 2022-02-14 DIAGNOSIS — I4891 Unspecified atrial fibrillation: Secondary | ICD-10-CM | POA: Diagnosis not present

## 2022-02-14 DIAGNOSIS — M8580 Other specified disorders of bone density and structure, unspecified site: Secondary | ICD-10-CM | POA: Diagnosis not present

## 2022-03-03 DIAGNOSIS — M79671 Pain in right foot: Secondary | ICD-10-CM | POA: Diagnosis not present

## 2022-03-03 DIAGNOSIS — Z6826 Body mass index (BMI) 26.0-26.9, adult: Secondary | ICD-10-CM | POA: Diagnosis not present

## 2022-03-07 DIAGNOSIS — I739 Peripheral vascular disease, unspecified: Secondary | ICD-10-CM | POA: Diagnosis not present

## 2022-03-18 ENCOUNTER — Other Ambulatory Visit: Payer: Self-pay | Admitting: *Deleted

## 2022-03-18 DIAGNOSIS — M79606 Pain in leg, unspecified: Secondary | ICD-10-CM

## 2022-03-19 ENCOUNTER — Ambulatory Visit (INDEPENDENT_AMBULATORY_CARE_PROVIDER_SITE_OTHER): Payer: Medicare PPO

## 2022-03-19 ENCOUNTER — Ambulatory Visit: Payer: Medicare PPO | Admitting: Vascular Surgery

## 2022-03-19 ENCOUNTER — Encounter: Payer: Self-pay | Admitting: Vascular Surgery

## 2022-03-19 VITALS — BP 152/83 | HR 80 | Temp 99.8°F | Resp 18 | Ht 61.0 in | Wt 134.6 lb

## 2022-03-19 DIAGNOSIS — I70223 Atherosclerosis of native arteries of extremities with rest pain, bilateral legs: Secondary | ICD-10-CM

## 2022-03-19 DIAGNOSIS — M79606 Pain in leg, unspecified: Secondary | ICD-10-CM

## 2022-03-19 NOTE — Progress Notes (Signed)
[facility] [place]  Patient name: Angela Stein MRN: [id number] DOB: 1951/04/27 Sex: female  REASON FOR CONSULT: Evaluation bilateral lower extremity arterial insufficiency  HPI: Angela Stein is a 71 y.o. 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  She reports this has been present for a number of years.  She has been worked up for degenerative disc disease in the past.  This was not felt to be related to arterial insufficiency in the past although she is really had no formal imaging studies.  She has multiple components right pain.  She reports her right leg is worse than her left.  This is total right leg.  Begins in her hip and extends into her leg.  She does have rest at night.  She has no tissue loss.  She has never had any back surgery.  Past Medical History:  Diagnosis Date   Atrial fibrillation [facility])     History reviewed. No pertinent family history.  SOCIAL HISTORY: Social History   Socioeconomic History   Marital status: Married    Spouse name: Not on file   Number of children: Not on file   Years of education: Not on file   Highest education level: Not on file  Occupational History   Not on file  Tobacco Use   Smoking status: Some Days    Types: Cigarettes   Smokeless tobacco: Never  Vaping Use   Vaping Use: Never used  Substance and Sexual Activity   Alcohol use: Yes    Comment: Once a year   Drug use: Never   Sexual activity: Not on file  Other Topics Concern   Not on file  Social History Narrative   Not on file   Social Determinants of Health   Financial Resource Strain: Not on file  Food Insecurity: Not on file  Transportation Needs: Not on file  Physical Activity: Not on file  Stress: Not on file  Social Connections: Not on file  Intimate Partner Violence: Not on file    Allergies  Allergen Reactions   Ciprofloxacin Hives   Lisinopril  Swelling    Current Outpatient Medications  Medication Sig Dispense Refill   amLODipine (NORVASC) 5 MG tablet Take 5 mg by mouth daily.     apixaban (ELIQUIS) 5 MG TABS tablet Take 1 tablet (5 mg total) by mouth 2 (two) times daily. 60 tablet    Calcium Carbonate (CALCIUM 600 PO) Take 500 mg by mouth 2 (two) times daily.      Cholecalciferol (VITAMIN D3) 25 MCG (1000 UT) CAPS Take 1,000 Units by mouth 2 (two) times daily.      furosemide (LASIX) 40 MG tablet Take 20 mg by mouth daily.      metoprolol tartrate (LOPRESSOR) 50 MG tablet Take 75 mg by mouth 2 (two) times daily.      saccharomyces boulardii (FLORASTOR) 250 MG capsule Take 250 mg by mouth daily at 12 noon.      valACYclovir (VALTREX) 1000 MG tablet Take 1,000 mg by mouth at bedtime.      No current facility-administered medications for this visit.    REVIEW OF SYSTEMS:  '[X]'$  denotes positive finding, '[ ]'$  denotes negative finding Cardiac  Comments:  Chest pain or chest pressure:    Shortness of breath upon exertion:    Short of breath when lying flat:    Irregular heart rhythm:        Vascular  Pain in calf, thigh, or hip brought on by ambulation:    Pain in feet at night that wakes you up from your sleep:     Blood clot in your veins:    Leg swelling:         Pulmonary    Oxygen at home:    Productive cough:     Wheezing:         Neurologic    Sudden weakness in arms or legs:     Sudden numbness in arms or legs:     Sudden onset of difficulty speaking or slurred speech:    Temporary loss of vision in one eye:     Problems with dizziness:         Gastrointestinal    Blood in stool:     Vomited blood:         Genitourinary    Burning when urinating:     Blood in urine:        Psychiatric    Major depression:         Hematologic    Bleeding problems:    Problems with blood clotting too easily:        Skin    Rashes or ulcers:        Constitutional    Fever or chills:      PHYSICAL  EXAM: Vitals:   03/19/22 1506  BP: (!) 152/83  Pulse: 80  Resp: 18  Temp: 99.8 F (37.7 C)  TempSrc: Temporal  SpO2: 95%  Weight: 134 lb 9.6 oz (61.1 kg)  Height: '5\' 1"'$  (1.549 m)    GENERAL: The patient is a well-nourished female, in no acute distress. The vital signs are documented above. CARDIOVASCULAR: Plus radial pulses bilaterally.  Absent femoral and popliteal and distal pulses bilaterally PULMONARY: There is good air exchange  MUSCULOSKELETAL: There are no major deformities or cyanosis. NEUROLOGIC: No focal weakness or paresthesias are detected. SKIN: There are no ulcers or rashes noted. PSYCHIATRIC: The patient has a normal affect.  DATA:  Noninvasive studies from 03/07/2022 were reviewed.  This reveals ankle arm index of 0.23 on the right and 0.42 on the left  MEDICAL ISSUES: Discussion with patient.  She does have a history of multiple complaints Yoplait in the lower extremities, she has severe arterial insufficiency.  Concerned this may represent chronic ischemia on the right.  I have recommended arteriography for further evaluation.  She understands that this will be done at [facility] with one of my partners as an outpatient.  If she does have a change blockage which is amenable to endovascular treatment, this will be done at the same setting.  If not there will be a discussion regarding surgical correction of her arterial insufficiency.   [name], MD FACS [facility] [phone] Pager [phone]  Note: Portions of this report may have been transcribed using voice recognition software.  Every effort has been made to ensure accuracy; however, inadvertent computerized transcription errors may still be present.

## 2022-03-19 NOTE — H&P (View-Only) (Signed)
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  She reports this has been present for a number of years.  She has been worked up for degenerative disc disease in the past.  This was not felt to be related to arterial insufficiency in the past although she is really had no formal imaging studies.  She has multiple components right pain.  She reports her right leg is worse than her left.  This is total right leg.  Begins in her hip and extends into her leg.  She does have rest at night.  She has no tissue loss.  She has never had any back surgery.  Past Medical History:  Diagnosis Date   Atrial fibrillation [facility])     History reviewed. No pertinent family history.  SOCIAL HISTORY: Social History   Socioeconomic History   Marital status: Married    Spouse name: Not on file   Number of children: Not on file   Years of education: Not on file   Highest education level: Not on file  Occupational History   Not on file  Tobacco Use   Smoking status: Some Days    Types: Cigarettes   Smokeless tobacco: Never  Vaping Use   Vaping Use: Never used  Substance and Sexual Activity   Alcohol use: Yes    Comment: Once a year   Drug use: Never   Sexual activity: Not on file  Other Topics Concern   Not on file  Social History Narrative   Not on file   Social Determinants of Health   Financial Resource Strain: Not on file  Food Insecurity: Not on file  Transportation Needs: Not on file  Physical Activity: Not on file  Stress: Not on file  Social Connections: Not on file  Intimate Partner Violence: Not on file    Allergies  Allergen Reactions   Ciprofloxacin Hives   Lisinopril  Swelling    Current Outpatient Medications  Medication Sig Dispense Refill   amLODipine (NORVASC) 5 MG tablet Take 5 mg by mouth daily.     apixaban (ELIQUIS) 5 MG TABS tablet Take 1 tablet (5 mg total) by mouth 2 (two) times daily. 60 tablet    Calcium Carbonate (CALCIUM 600 PO) Take 500 mg by mouth 2 (two) times daily.      Cholecalciferol (VITAMIN D3) 25 MCG (1000 UT) CAPS Take 1,000 Units by mouth 2 (two) times daily.      furosemide (LASIX) 40 MG tablet Take 20 mg by mouth daily.      metoprolol tartrate (LOPRESSOR) 50 MG tablet Take 75 mg by mouth 2 (two) times daily.      saccharomyces boulardii (FLORASTOR) 250 MG capsule Take 250 mg by mouth daily at 12 noon.      valACYclovir (VALTREX) 1000 MG tablet Take 1,000 mg by mouth at bedtime.      No current facility-administered medications for this visit.    REVIEW OF SYSTEMS:  '[X]'$  denotes positive finding, '[ ]'$  denotes negative finding Cardiac  Comments:  Chest pain or chest pressure:    Shortness of breath upon exertion:    Short of breath when lying flat:    Irregular heart rhythm:        Vascular  Pain in calf, thigh, or hip brought on by ambulation:    Pain in feet at night that wakes you up from your sleep:     Blood clot in your veins:    Leg swelling:         Pulmonary    Oxygen at home:    Productive cough:     Wheezing:         Neurologic    Sudden weakness in arms or legs:     Sudden numbness in arms or legs:     Sudden onset of difficulty speaking or slurred speech:    Temporary loss of vision in one eye:     Problems with dizziness:         Gastrointestinal    Blood in stool:     Vomited blood:         Genitourinary    Burning when urinating:     Blood in urine:        Psychiatric    Major depression:         Hematologic    Bleeding problems:    Problems with blood clotting too easily:        Skin    Rashes or ulcers:        Constitutional    Fever or chills:      PHYSICAL  EXAM: Vitals:   03/19/22 1506  BP: (!) 152/83  Pulse: 80  Resp: 18  Temp: 99.8 F (37.7 C)  TempSrc: Temporal  SpO2: 95%  Weight: 134 lb 9.6 oz (61.1 kg)  Height: '5\' 1"'$  (1.549 m)    GENERAL: The patient is a well-nourished female, in no acute distress. The vital signs are documented above. CARDIOVASCULAR: Plus radial pulses bilaterally.  Absent femoral and popliteal and distal pulses bilaterally PULMONARY: There is good air exchange  MUSCULOSKELETAL: There are no major deformities or cyanosis. NEUROLOGIC: No focal weakness or paresthesias are detected. SKIN: There are no ulcers or rashes noted. PSYCHIATRIC: The patient has a normal affect.  DATA:  Noninvasive studies from 03/07/2022 were reviewed.  This reveals ankle arm index of 0.23 on the right and 0.42 on the left  MEDICAL ISSUES: Discussion with patient.  She does have a history of multiple complaints Yoplait in the lower extremities, she has severe arterial insufficiency.  Concerned this may represent chronic ischemia on the right.  I have recommended arteriography for further evaluation.  She understands that this will be done at [facility] with one of my partners as an outpatient.  If she does have a change blockage which is amenable to endovascular treatment, this will be done at the same setting.  If not there will be a discussion regarding surgical correction of her arterial insufficiency.   [name], MD FACS [facility] [phone] Pager [phone]  Note: Portions of this report may have been transcribed using voice recognition software.  Every effort has been made to ensure accuracy; however, inadvertent computerized transcription errors may still be present.

## 2022-04-07 DIAGNOSIS — R300 Dysuria: Secondary | ICD-10-CM | POA: Diagnosis not present

## 2022-04-07 DIAGNOSIS — Z6826 Body mass index (BMI) 26.0-26.9, adult: Secondary | ICD-10-CM | POA: Diagnosis not present

## 2022-04-08 ENCOUNTER — Other Ambulatory Visit: Payer: Self-pay

## 2022-04-08 DIAGNOSIS — I70223 Atherosclerosis of native arteries of extremities with rest pain, bilateral legs: Secondary | ICD-10-CM

## 2022-04-11 ENCOUNTER — Ambulatory Visit (HOSPITAL_COMMUNITY)
Admission: RE | Admit: 2022-04-11 | Discharge: 2022-04-11 | Disposition: A | Discharge status: Home / Self Care | Payer: Medicare PPO | Attending: Vascular Surgery | Admitting: Vascular Surgery

## 2022-04-11 ENCOUNTER — Encounter (HOSPITAL_COMMUNITY)
Admission: RE | Disposition: A | Discharge status: Home / Self Care | Payer: Self-pay | Source: Home / Self Care | Attending: Vascular Surgery

## 2022-04-11 ENCOUNTER — Other Ambulatory Visit: Payer: Self-pay

## 2022-04-11 DIAGNOSIS — I739 Peripheral vascular disease, unspecified: Secondary | ICD-10-CM | POA: Insufficient documentation

## 2022-04-11 DIAGNOSIS — M79604 Pain in right leg: Secondary | ICD-10-CM | POA: Diagnosis not present

## 2022-04-11 DIAGNOSIS — I70213 Atherosclerosis of native arteries of extremities with intermittent claudication, bilateral legs: Secondary | ICD-10-CM | POA: Diagnosis not present

## 2022-04-11 DIAGNOSIS — I6522 Occlusion and stenosis of left carotid artery: Secondary | ICD-10-CM | POA: Insufficient documentation

## 2022-04-11 DIAGNOSIS — I700 Atherosclerosis of aorta: Secondary | ICD-10-CM | POA: Diagnosis not present

## 2022-04-11 DIAGNOSIS — I70223 Atherosclerosis of native arteries of extremities with rest pain, bilateral legs: Secondary | ICD-10-CM

## 2022-04-11 HISTORY — PX: AORTIC ARCH ANGIOGRAPHY: CATH118224

## 2022-04-11 HISTORY — PX: ABDOMINAL AORTOGRAM W/LOWER EXTREMITY: CATH118223

## 2022-04-11 LAB — POCT I-STAT, CHEM 8
BUN: 28 mg/dL — ABNORMAL HIGH (ref 8–23)
Calcium, Ion: 1.09 mmol/L — ABNORMAL LOW (ref 1.15–1.40)
Chloride: 98 mmol/L (ref 98–111)
Creatinine, Ser: 0.6 mg/dL (ref 0.44–1.00)
Glucose, Bld: 102 mg/dL — ABNORMAL HIGH (ref 70–99)
HCT: 38 % (ref 36.0–46.0)
Hemoglobin: 12.9 g/dL (ref 12.0–15.0)
Potassium: 4.8 mmol/L (ref 3.5–5.1)
Sodium: 135 mmol/L (ref 135–145)
TCO2: 28 mmol/L (ref 22–32)

## 2022-04-11 LAB — POCT ACTIVATED CLOTTING TIME: Activated Clotting Time: 173 seconds

## 2022-04-11 SURGERY — ABDOMINAL AORTOGRAM W/LOWER EXTREMITY
Anesthesia: LOCAL | Laterality: Bilateral

## 2022-04-11 MED ORDER — FENTANYL CITRATE (PF) 100 MCG/2ML IJ SOLN
INTRAMUSCULAR | Status: AC
  Filled 2022-04-11: qty 2

## 2022-04-11 MED ORDER — ONDANSETRON HCL 4 MG/2ML IJ SOLN: 4.0000 mg | Freq: Four times a day (QID) | INTRAMUSCULAR | Status: DC | PRN

## 2022-04-11 MED ORDER — SODIUM CHLORIDE 0.9 % WEIGHT BASED INFUSION
1.0000 mL/kg/h | INTRAVENOUS | Status: DC
  Administered 2022-04-11: 1 mL/kg/h via INTRAVENOUS

## 2022-04-11 MED ORDER — HYDRALAZINE HCL 20 MG/ML IJ SOLN: 5.0000 mg | INTRAMUSCULAR | Status: DC | PRN

## 2022-04-11 MED ORDER — LIDOCAINE HCL (PF) 1 % IJ SOLN
INTRAMUSCULAR | Status: DC | PRN
  Administered 2022-04-11: 10 mL
  Administered 2022-04-11: 15 mL

## 2022-04-11 MED ORDER — HYDRALAZINE HCL 20 MG/ML IJ SOLN
INTRAMUSCULAR | Status: AC
  Filled 2022-04-11: qty 1

## 2022-04-11 MED ORDER — FENTANYL CITRATE (PF) 100 MCG/2ML IJ SOLN
INTRAMUSCULAR | Status: DC | PRN
  Administered 2022-04-11: 50 ug via INTRAVENOUS

## 2022-04-11 MED ORDER — NITROGLYCERIN 1 MG/10 ML FOR IR/CATH LAB
INTRA_ARTERIAL | Status: AC
  Filled 2022-04-11: qty 10

## 2022-04-11 MED ORDER — HYDRALAZINE HCL 20 MG/ML IJ SOLN
INTRAMUSCULAR | Status: DC | PRN
  Administered 2022-04-11: 10 mg via INTRAVENOUS

## 2022-04-11 MED ORDER — HEPARIN SODIUM (PORCINE) 1000 UNIT/ML IJ SOLN
INTRAMUSCULAR | Status: AC
Start: 2022-04-11 — End: ?
  Filled 2022-04-11: qty 10

## 2022-04-11 MED ORDER — SODIUM CHLORIDE 0.9% FLUSH
3.0000 mL | INTRAVENOUS | Status: DC | PRN
Start: 2022-04-11 — End: 2022-04-11

## 2022-04-11 MED ORDER — LIDOCAINE HCL (PF) 1 % IJ SOLN
INTRAMUSCULAR | Status: AC
  Filled 2022-04-11: qty 30

## 2022-04-11 MED ORDER — MIDAZOLAM HCL 2 MG/2ML IJ SOLN
INTRAMUSCULAR | Status: AC
  Filled 2022-04-11: qty 2

## 2022-04-11 MED ORDER — LABETALOL HCL 5 MG/ML IV SOLN
INTRAVENOUS | Status: DC | PRN
  Administered 2022-04-11: 10 mg via INTRAVENOUS

## 2022-04-11 MED ORDER — SODIUM CHLORIDE 0.9% FLUSH: 3.0000 mL | Freq: Two times a day (BID) | INTRAVENOUS | Status: DC

## 2022-04-11 MED ORDER — HEPARIN (PORCINE) IN NACL 1000-0.9 UT/500ML-% IV SOLN
INTRAVENOUS | Status: DC | PRN
  Administered 2022-04-11 (×2): 500 mL

## 2022-04-11 MED ORDER — SODIUM CHLORIDE 0.9 % IV SOLN: INTRAVENOUS | Status: DC

## 2022-04-11 MED ORDER — HEPARIN (PORCINE) IN NACL 1000-0.9 UT/500ML-% IV SOLN
INTRAVENOUS | Status: AC
  Filled 2022-04-11: qty 1000

## 2022-04-11 MED ORDER — LABETALOL HCL 5 MG/ML IV SOLN: 10.0000 mg | INTRAVENOUS | Status: DC | PRN

## 2022-04-11 MED ORDER — MIDAZOLAM HCL 2 MG/2ML IJ SOLN
INTRAMUSCULAR | Status: DC | PRN
  Administered 2022-04-11: 2 mg via INTRAVENOUS

## 2022-04-11 MED ORDER — ACETAMINOPHEN 325 MG PO TABS: 650.0000 mg | ORAL_TABLET | ORAL | Status: DC | PRN

## 2022-04-11 MED ORDER — SODIUM CHLORIDE 0.9 % IV SOLN: 250.0000 mL | INTRAVENOUS | Status: DC | PRN

## 2022-04-11 MED ORDER — LABETALOL HCL 5 MG/ML IV SOLN
INTRAVENOUS | Status: AC
  Filled 2022-04-11: qty 4

## 2022-04-11 MED ORDER — IODIXANOL 320 MG/ML IV SOLN
INTRAVENOUS | Status: DC | PRN
  Administered 2022-04-11: 220 mL

## 2022-04-11 MED ORDER — NITROGLYCERIN 1 MG/10 ML FOR IR/CATH LAB
INTRA_ARTERIAL | Status: DC | PRN
  Administered 2022-04-11: 4 mL via INTRA_ARTERIAL

## 2022-04-11 SURGICAL SUPPLY — 16 items
CATH ANGIO 5F BER2 65CM (CATHETERS) IMPLANT
CATH ANGIO 5F PIGTAIL 100CM (CATHETERS) IMPLANT
CATH ANGIO 5F PIGTAIL 65CM (CATHETERS) IMPLANT
GUIDEWIRE ANGLED .035X150CM (WIRE) IMPLANT
KIT MICROPUNCTURE NIT STIFF (SHEATH) IMPLANT
KIT PV (KITS) ×1 IMPLANT
SHEATH PINNACLE 5F 10CM (SHEATH) IMPLANT
SHEATH PINNACLE R/O II 5F 6CM (SHEATH) IMPLANT
SHEATH PROBE COVER 6X72 (BAG) IMPLANT
SYR MEDRAD MARK V 150ML (SYRINGE) IMPLANT
TRANSDUCER W/STOPCOCK (MISCELLANEOUS) ×1 IMPLANT
TRAY PV CATH (CUSTOM PROCEDURE TRAY) ×1 IMPLANT
TUBING CIL FLEX 10 FLL-RA (TUBING) IMPLANT
WIRE HITORQ VERSACORE ST 145CM (WIRE) IMPLANT
WIRE TORQFLEX AUST .018X40CM (WIRE) IMPLANT
WIRE VERSACORE LOC 115CM (WIRE) IMPLANT

## 2022-04-11 NOTE — Progress Notes (Signed)
Purewick placed, peri care given, tolerated well, +void noted, safety maintained

## 2022-04-11 NOTE — Interval H&P Note (Signed)
History and Physical Interval Note:  04/11/2022 7:28 AM  Angela Stein  has presented today for surgery, with the diagnosis of ischemia.  The various methods of treatment have been discussed with the patient and family. After consideration of risks, benefits and other options for treatment, the patient has consented to  Procedure(s): ABDOMINAL AORTOGRAM W/LOWER EXTREMITY (N/A) as a surgical intervention.  The patient's history has been reviewed, patient examined, no change in status, stable for surgery.  I have reviewed the patient's chart and labs.  Questions were answered to the patient's satisfaction.     Deitra Mayo

## 2022-04-11 NOTE — Progress Notes (Signed)
75f sheath aspirated and removed from left brachial artery. Manual pressure applied for 30 minutes.  Site level 0, no S+S of hematoma, tegaderm dressing applied. Bedrest instructions given.   Left radial pulse easily palpable. SP02 94% as measured in left thumb.    Bedrest for left arm begins at 10:30:00

## 2022-04-11 NOTE — Op Note (Signed)
PATIENT: Angela Stein      MRN: [id number] DOB: [birth year]    DATE OF PROCEDURE: 04/11/2022  INDICATIONS:    Angela Stein is a 71 y.o. female who was seen by Dr. Donnetta Hutching with bilateral lower extremity claudication.  She was set up for an arteriogram.  She had absent femoral pulses.  PROCEDURE:    Conscious sedation  Ultrasound-guided access to the right common femoral artery Retrograde right femoral arteriogram Ultrasound-guided access to the left brachial artery Selective catheterization of the ascending aorta (first-order catheterization with arch aortogram Selective catheterization of the descending thoracic aorta with aortogram bilateral iliac arteriogram and left lower extremity runoff  SURGEON: Judeth Cornfield. Scot Dock, MD, FACS  ANESTHESIA: Local with sedation  EBL: Minimal  TECHNIQUE: The patient was brought to the peripheral vascular lab and was sedated. The period of conscious sedation was 86 minutes.  During that time period, I was present face-to-face 100% of the time.  The patient was administered 1 mg of Versed and 50 mcg of fentanyl.. The patient's heart rate, blood pressure, and oxygen saturation were monitored by the nurse continuously during the procedure.  Both groins were prepped and draped in the usual sterile fashion.  I could not palpate femoral pulses on either side.  Under ultrasound guidance, after the skin was anesthetized, I cannulated the right common femoral artery with a micropuncture needle and a micropuncture sheath was introduced over a wire.  This was exchanged for a 5 [place] sheath over a Bentson wire.  By ultrasound the femoral artery was patent. A real-time image was obtained and sent to the server.  The wire would not advance into the right common iliac artery.  I used an angled support catheter and Glidewire but was unsuccessful.  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  With the sheath in place a  retrograde right femoral arteriogram was obtained with right lower extremity runoff.  I then elected to stick the left brachial artery.  The left arm was prepped and draped in usual sterile fashion.  Under ultrasound guidance, after the skin was anesthetized, I cannulated the left brachial artery with a micropuncture needle and a micropuncture sheath was introduced over the wire.  The patient did receive 2000 units of IV heparin through the sheath and 200 mcg of nitroglycerin through the sheath.  I was then able to get the Bentson wire into the subclavian artery using a directional catheter and ultimately down into the ascending aorta.  The pigtail catheter was positioned over the ascending aorta and arch aortogram obtained.  I then used the catheter to direct the pigtail catheter down into the descending thoracic aorta to the suprarenal aorta.  Aortogram with left lower extremity runoff within it was then obtained.  The catheter was then removed over a wire.  The patient was transferred to the holding area for removal of the sheaths.  No immediate complications were noted.  FINDINGS:   Arch aortogram shows significant calcific disease in the superior aspect of the aortic arch near the great vessels.  There is also a focal proximal left common carotid artery stenosis.  The innominate artery right subclavian and right proximal common carotid artery are patent.  There is some mild diffuse disease in the left subclavian and axillary arteries. There are 2 renal arteries on the right and single renal artery on the left.  The infrarenal aorta is heavily calcified. 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  There is reconstitution of the external iliac artery via the hypogastric.  The external iliac is small with some mild diffuse disease.  The common femoral, deep femoral, superficial femoral, popliteal, anterior tibial, tibial peroneal trunk, peroneal, and posterior tibial arteries are  patent.  The superficial femoral artery is small with mild diffuse disease. On the left side the common and external iliac arteries are heavily calcified.  There is moderate diffuse disease throughout the common and external iliac artery.  The common femoral, deep femoral, superficial femoral, popliteal, anterior tibial, tibial peroneal trunk, peroneal, and posterior tibial arteries are patent.  There is mild diffuse disease in the superficial femoral artery which is small.  CLINICAL NOTE: This patient has significant claudication of both lower extremities but more significantly on the right side.  She also has some degenerative disc disease of her back and some pain in her legs with standing which may be related to her back issues.  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  Her infrarenal aorta is heavily calcified.  If we were going to consider surgery would obtain a CT angiogram of the abdomen and pelvis to further evaluate the extent of her calcific disease.  The best approach may potentially be a right axillobifemoral bypass graft as she does appear to have some disease in the axillary and subclavian arteries on the left.  In addition her symptoms are more significant on the right side.  This will be a reasonable option if she feels that her claudication is significantly disabling.  Given the diffuse calcific disease of the left, addressing the inflow disease on the left with femoral-femoral bypass is less than ideal.  We had a long discussion about the importance of tobacco cessation.  In addition, I encouraged her to continue to walk is much as possible.  I will get her  back in to see me in office in several weeks we can decide whether to proceed with further work-up including a CT angio of the abdomen pelvis prior to considering the best option for revascularization, or whether she would like to continue with conservative measures.   Deitra Mayo, MD, FACS [facility]  DATE OF DICTATION:   04/11/2022

## 2022-04-11 NOTE — Progress Notes (Signed)
Took over patient care at 1345, D/C instructions done by previous RN per report.

## 2022-04-11 NOTE — Progress Notes (Signed)
SITE AREA: right femoral/groin  SITE PRIOR TO REMOVAL:  LEVEL 0  PRESSURE APPLIED FOR: approximately 21 minutes  MANUAL: yes  PATIENT STATUS DURING PULL: stable  POST PULL SITE:  LEVEL 0  POST PULL INSTRUCTIONS GIVEN: yes  POST PULL PULSES PRESENT: right pedal palpable at +1  DRESSING APPLIED: gauze with tegaderm  BEDREST BEGINS @ 6015  COMMENTS: 2H RN, [place], removed sheath, This RN at bedside, Aaron Edelman, [place] at bedside removing left brachial sheath

## 2022-04-15 ENCOUNTER — Encounter (HOSPITAL_COMMUNITY): Payer: Self-pay | Admitting: Vascular Surgery

## 2022-05-03 DIAGNOSIS — Z23 Encounter for immunization: Secondary | ICD-10-CM | POA: Diagnosis not present

## 2022-05-07 DIAGNOSIS — D6859 Other primary thrombophilia: Secondary | ICD-10-CM | POA: Diagnosis not present

## 2022-05-07 DIAGNOSIS — M810 Age-related osteoporosis without current pathological fracture: Secondary | ICD-10-CM | POA: Diagnosis not present

## 2022-05-07 DIAGNOSIS — Z7901 Long term (current) use of anticoagulants: Secondary | ICD-10-CM | POA: Diagnosis not present

## 2022-05-08 ENCOUNTER — Encounter: Payer: Self-pay | Admitting: Vascular Surgery

## 2022-05-08 ENCOUNTER — Ambulatory Visit: Payer: Medicare PPO | Admitting: Vascular Surgery

## 2022-05-08 ENCOUNTER — Other Ambulatory Visit (HOSPITAL_COMMUNITY): Payer: Self-pay | Admitting: Vascular Surgery

## 2022-05-08 ENCOUNTER — Ambulatory Visit (HOSPITAL_COMMUNITY)
Admission: RE | Admit: 2022-05-08 | Discharge: 2022-05-08 | Disposition: A | Discharge status: Home / Self Care | Payer: Medicare PPO | Source: Ambulatory Visit | Attending: Vascular Surgery | Admitting: Vascular Surgery

## 2022-05-08 VITALS — BP 163/74 | HR 62 | Temp 98.2°F | Resp 20 | Ht 61.0 in | Wt 134.8 lb

## 2022-05-08 DIAGNOSIS — R0989 Other specified symptoms and signs involving the circulatory and respiratory systems: Secondary | ICD-10-CM | POA: Insufficient documentation

## 2022-05-08 DIAGNOSIS — I7409 Other arterial embolism and thrombosis of abdominal aorta: Secondary | ICD-10-CM

## 2022-05-08 NOTE — Progress Notes (Signed)
REASON FOR VISIT:   To discuss conservative treatment versus right axillobifemoral bypass grafting or aortofemoral bypass grafting.  MEDICAL ISSUES:   AORTOILIAC OCCLUSIVE DISEASE: This patient has stable claudication of both lower extremities.  She has severe aortoiliac occlusive disease.  She seems highly motivated and is only smoked 4 cigarettes since her arteriogram on 04/11/2022.  She is also been increasing her steps by 200 each day and is on a structured walking program.  We have also discussed the importance of nutrition.  I explained that we have 3 options.  If her symptoms are tolerable I would continue with conservative treatment.  This is what she would prefer.  If her symptoms progressed the options would be aortobifemoral bypass grafting or right axillobifemoral bypass grafting.  If we elected to proceed with aortofemoral bypass grafting she would need a CT angio of her abdomen pelvis to evaluate the calcific disease in her aorta to determine if she would be a candidate for this.  Her arteriogram showed significant calcific disease.  We have discussed both of these options.  She would prefer to hold off on surgery which I think is perfectly reasonable.  LEFT CAROTID BRUIT: The patient has an asymptomatic 60 to 79% left carotid stenosis.  She understands we would not consider carotid endarterectomy unless the stenosis progressed to greater than 80%.  I have instructed her to begin taking 81 mg of aspirin daily despite her Eliquis.  She has no history of GI bleeds.  HPI:   Angela Stein is a pleasant 71 y.o. female who was seen by Dr. Donnetta Hutching with claudication.  The patient was set up for an arteriogram.  She had absent femoral pulses.  On 04/11/2022 she underwent aortogram with runoff via a left brachial approach.  The results of her arteriogram are discussed below.  The patient also has degenerative disc disease of her back and some pain in her legs with standing.  Thus not all of her  symptoms can be attributed to her aortoiliac occlusive disease.  I felt that if we were going to consider 's aortofemoral bypass grafting she would need a CT angiogram to further evaluate her aorta as it is heavily calcified.  Alternatively if she felt the need for surgery a right axillobifemoral bypass graft appeared to be a reasonable option.  We had a long discussion about the importance of tobacco cessation.  I encouraged her to walk is much as possible.  She comes in for a follow-up visit.  My history today, the patient describes hip thigh and calf claudication which is brought on by ambulation and relieved with rest.  She has symptoms on both sides but her symptoms are much more significant on the right.  She also describes similar symptoms when she is simply standing.  She also has a history of back pain.  I do not get any clear-cut history of rest pain or nonhealing ulcers.  She does not take aspirin as she is on Eliquis.  She does not take a statin as her cholesterol has been under excellent control.  Since her arteriogram she is only smoked 4 cigarettes.  She denies any history of stroke, TIAs, expressive or receptive aphasia, or amaurosis fugax.  Past Medical History:  Diagnosis Date   Atrial fibrillation [facility])     History reviewed. No pertinent family history.  SOCIAL HISTORY: Social History   Tobacco Use   Smoking status: Some Days    Types: Cigarettes   Smokeless tobacco: Never  Substance Use Topics   Alcohol use: Yes    Comment: Once a year    Allergies  Allergen Reactions   Ciprofloxacin Hives   Lisinopril Swelling    Current Outpatient Medications  Medication Sig Dispense Refill   amLODipine (NORVASC) 5 MG tablet Take 5 mg by mouth daily.     apixaban (ELIQUIS) 5 MG TABS tablet Take 1 tablet (5 mg total) by mouth 2 (two) times daily. 60 tablet    Calcium Carbonate (CALCIUM 600 PO) Take 600 mg by mouth 2 (two) times daily.     Cholecalciferol (VITAMIN D3) 25  MCG (1000 UT) CAPS Take 1,000 Units by mouth 2 (two) times daily.      dofetilide (TIKOSYN) 500 MCG capsule Take 500 mcg by mouth every 12 (twelve) hours.     furosemide (LASIX) 40 MG tablet Take 20 mg by mouth daily.      losartan (COZAAR) 50 MG tablet Take 50 mg by mouth 2 (two) times daily.     metoprolol tartrate (LOPRESSOR) 50 MG tablet Take 75 mg by mouth 2 (two) times daily.      saccharomyces boulardii (FLORASTOR) 250 MG capsule Take 250 mg by mouth daily.     valACYclovir (VALTREX) 1000 MG tablet Take 1,000 mg by mouth daily as needed (shingles/mouth ulcers).     No current facility-administered medications for this visit.    REVIEW OF SYSTEMS:  '[X]'$  denotes positive finding, '[ ]'$  denotes negative finding Cardiac  Comments:  Chest pain or chest pressure:    Shortness of breath upon exertion:    Short of breath when lying flat:    Irregular heart rhythm:        Vascular    Pain in calf, thigh, or hip brought on by ambulation: x   Pain in feet at night that wakes you up from your sleep:  x   Blood clot in your veins:    Leg swelling:  x       Pulmonary    Oxygen at home:    Productive cough:     Wheezing:         Neurologic    Sudden weakness in arms or legs:     Sudden numbness in arms or legs:     Sudden onset of difficulty speaking or slurred speech:    Temporary loss of vision in one eye:     Problems with dizziness:         Gastrointestinal    Blood in stool:     Vomited blood:         Genitourinary    Burning when urinating:     Blood in urine:        Psychiatric    Major depression:         Hematologic    Bleeding problems:    Problems with blood clotting too easily:        Skin    Rashes or ulcers:        Constitutional    Fever or chills:     PHYSICAL EXAM:   Vitals:   05/08/22 0901  BP: (!) 163/74  Pulse: 62  Resp: 20  Temp: 98.2 F (36.8 C)  SpO2: 95%  Weight: 134 lb 12.8 oz (61.1 kg)  Height: '5\' 1"'$  (1.549 m)    GENERAL: The  patient is a well-nourished female, in no acute distress. The vital signs are documented above. CARDIAC: There is a regular rate and rhythm.  VASCULAR:  She has a left carotid bruit. I cannot palpate femoral pulses. I cannot palpate pedal pulses. She has no significant lower extremity swelling. PULMONARY: There is good air exchange bilaterally without wheezing or rales. ABDOMEN: Soft and non-tender with normal pitched bowel sounds.  MUSCULOSKELETAL: There are no major deformities or cyanosis. NEUROLOGIC: No focal weakness or paresthesias are detected. SKIN: There are no ulcers or rashes noted. PSYCHIATRIC: The patient has a normal affect.  DATA:    AORTOGRAM: I reviewed the images of her aortogram from 04/11/2022.  Arch aortogram shows significant calcific disease in the superior aspect of the aortic arch near the great vessels.  There was also a focal proximal left common carotid artery stenosis.  The right subclavian artery and proximal right common carotid artery were patent.  The innominate artery was patent.  There was mild diffuse disease in the left subclavian artery and axillary arteries.  The infrarenal aorta was heavily calcified.  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  There was reconstitution of the external iliac artery.  She had good runoff below that.  On the left side the common and external iliac arteries were heavily calcified.  There was moderate diffuse disease throughout the common and external iliac artery.  Below that she had mild diffuse disease in the superficial femoral artery which was small.  She has no history of GI bleed so I have recommended that she begin taking 81 mg of aspirin daily.  Did have a long discussion about taking a low-dose statin and she feels quite strongly about not taking a statin as multiple family members have had problems with this.  CAROTID DUPLEX: The patient had a left carotid bruit.  This prompted a carotid duplex  scan today.  I have independently interpreted the study.  On the right side she has a less than 39% stenosis.  The right vertebral artery is patent with antegrade flow.  On the left side she has a 60 to 79% stenosis.  The left vertebral artery is patent with antegrade flow.  ARTERIAL DOPPLER STUDY: I have reviewed her arterial Doppler study that was done on 03/19/2022.  On the right side there was a monophasic posterior tibial signal with no dorsalis pedis signal.  ABI was 28%.  Toe pressure was 0.  On the left side there was a monophasic dorsalis pedis signal with no posterior tibial signal.  ABI was 40%.  Toe pressure was 43 mmHg.    Deitra Mayo [facility] [phone]

## 2022-05-12 DIAGNOSIS — Z6826 Body mass index (BMI) 26.0-26.9, adult: Secondary | ICD-10-CM | POA: Diagnosis not present

## 2022-05-12 DIAGNOSIS — Z20828 Contact with and (suspected) exposure to other viral communicable diseases: Secondary | ICD-10-CM | POA: Diagnosis not present

## 2022-05-12 DIAGNOSIS — I4811 Longstanding persistent atrial fibrillation: Secondary | ICD-10-CM | POA: Diagnosis not present

## 2022-05-12 DIAGNOSIS — I4819 Other persistent atrial fibrillation: Secondary | ICD-10-CM | POA: Diagnosis not present

## 2022-05-12 DIAGNOSIS — J40 Bronchitis, not specified as acute or chronic: Secondary | ICD-10-CM | POA: Diagnosis not present

## 2022-05-13 ENCOUNTER — Other Ambulatory Visit: Payer: Self-pay

## 2022-05-13 DIAGNOSIS — I6529 Occlusion and stenosis of unspecified carotid artery: Secondary | ICD-10-CM

## 2022-05-13 DIAGNOSIS — I7409 Other arterial embolism and thrombosis of abdominal aorta: Secondary | ICD-10-CM

## 2022-05-13 DIAGNOSIS — I70223 Atherosclerosis of native arteries of extremities with rest pain, bilateral legs: Secondary | ICD-10-CM

## 2022-05-14 DIAGNOSIS — M810 Age-related osteoporosis without current pathological fracture: Secondary | ICD-10-CM | POA: Diagnosis not present

## 2022-05-14 DIAGNOSIS — Z122 Encounter for screening for malignant neoplasm of respiratory organs: Secondary | ICD-10-CM | POA: Diagnosis not present

## 2022-05-14 DIAGNOSIS — D6859 Other primary thrombophilia: Secondary | ICD-10-CM | POA: Diagnosis not present

## 2022-05-14 DIAGNOSIS — Z87891 Personal history of nicotine dependence: Secondary | ICD-10-CM | POA: Diagnosis not present

## 2022-05-20 ENCOUNTER — Telehealth: Payer: Self-pay

## 2022-05-20 ENCOUNTER — Other Ambulatory Visit: Payer: Self-pay | Admitting: Physician Assistant

## 2022-05-20 MED ORDER — PRAVASTATIN SODIUM 20 MG PO TABS
20.0000 mg | ORAL_TABLET | Freq: Every day | ORAL | 2 refills | Status: DC
Start: 2022-05-20 — End: 2022-12-24

## 2022-05-20 NOTE — Telephone Encounter (Signed)
Pt called stating that she had spoke with Dr Scot Dock at her last appt about starting a low dose statin. She had turned it down at that time, but after speaking to her family and other providers, she wanted to try it.  Reviewed pt's chart, returned call for clarification, two identifiers used. She requested the medication be sent to [facility] and thought Dr Scot Dock had mentioned Crestor, but was unsure. Spoke to [place], [place], who sent in prescription for pt.

## 2022-05-21 DIAGNOSIS — R059 Cough, unspecified: Secondary | ICD-10-CM | POA: Diagnosis not present

## 2022-05-21 DIAGNOSIS — J441 Chronic obstructive pulmonary disease with (acute) exacerbation: Secondary | ICD-10-CM | POA: Diagnosis not present

## 2022-05-23 DIAGNOSIS — I4819 Other persistent atrial fibrillation: Secondary | ICD-10-CM | POA: Diagnosis not present

## 2022-06-09 DIAGNOSIS — Z780 Asymptomatic menopausal state: Secondary | ICD-10-CM | POA: Diagnosis not present

## 2022-06-09 DIAGNOSIS — M8580 Other specified disorders of bone density and structure, unspecified site: Secondary | ICD-10-CM | POA: Diagnosis not present

## 2022-06-25 DIAGNOSIS — Z23 Encounter for immunization: Secondary | ICD-10-CM | POA: Diagnosis not present

## 2022-07-16 DIAGNOSIS — I2510 Atherosclerotic heart disease of native coronary artery without angina pectoris: Secondary | ICD-10-CM | POA: Diagnosis not present

## 2022-07-16 DIAGNOSIS — I4819 Other persistent atrial fibrillation: Secondary | ICD-10-CM | POA: Diagnosis not present

## 2022-07-16 DIAGNOSIS — I5032 Chronic diastolic (congestive) heart failure: Secondary | ICD-10-CM | POA: Diagnosis not present

## 2022-07-16 DIAGNOSIS — I10 Essential (primary) hypertension: Secondary | ICD-10-CM | POA: Diagnosis not present

## 2022-08-12 DIAGNOSIS — J449 Chronic obstructive pulmonary disease, unspecified: Secondary | ICD-10-CM | POA: Diagnosis not present

## 2022-08-12 DIAGNOSIS — K5732 Diverticulitis of large intestine without perforation or abscess without bleeding: Secondary | ICD-10-CM | POA: Diagnosis not present

## 2022-08-12 DIAGNOSIS — R911 Solitary pulmonary nodule: Secondary | ICD-10-CM | POA: Diagnosis not present

## 2022-08-12 DIAGNOSIS — Z87891 Personal history of nicotine dependence: Secondary | ICD-10-CM | POA: Diagnosis not present

## 2022-08-12 DIAGNOSIS — E7849 Other hyperlipidemia: Secondary | ICD-10-CM | POA: Diagnosis not present

## 2022-08-12 DIAGNOSIS — Z1329 Encounter for screening for other suspected endocrine disorder: Secondary | ICD-10-CM | POA: Diagnosis not present

## 2022-08-12 DIAGNOSIS — I10 Essential (primary) hypertension: Secondary | ICD-10-CM | POA: Diagnosis not present

## 2022-08-18 DIAGNOSIS — I5032 Chronic diastolic (congestive) heart failure: Secondary | ICD-10-CM | POA: Diagnosis not present

## 2022-08-18 DIAGNOSIS — I4891 Unspecified atrial fibrillation: Secondary | ICD-10-CM | POA: Diagnosis not present

## 2022-08-18 DIAGNOSIS — M8580 Other specified disorders of bone density and structure, unspecified site: Secondary | ICD-10-CM | POA: Diagnosis not present

## 2022-08-18 DIAGNOSIS — Z0001 Encounter for general adult medical examination with abnormal findings: Secondary | ICD-10-CM | POA: Diagnosis not present

## 2022-08-18 DIAGNOSIS — E7849 Other hyperlipidemia: Secondary | ICD-10-CM | POA: Diagnosis not present

## 2022-08-18 DIAGNOSIS — D692 Other nonthrombocytopenic purpura: Secondary | ICD-10-CM | POA: Diagnosis not present

## 2022-08-18 DIAGNOSIS — F17210 Nicotine dependence, cigarettes, uncomplicated: Secondary | ICD-10-CM | POA: Diagnosis not present

## 2022-08-18 DIAGNOSIS — I10 Essential (primary) hypertension: Secondary | ICD-10-CM | POA: Diagnosis not present

## 2022-08-18 DIAGNOSIS — D6869 Other thrombophilia: Secondary | ICD-10-CM | POA: Diagnosis not present

## 2022-09-17 DIAGNOSIS — F17210 Nicotine dependence, cigarettes, uncomplicated: Secondary | ICD-10-CM | POA: Diagnosis not present

## 2022-09-17 DIAGNOSIS — Z122 Encounter for screening for malignant neoplasm of respiratory organs: Secondary | ICD-10-CM | POA: Diagnosis not present

## 2022-09-17 DIAGNOSIS — Z87891 Personal history of nicotine dependence: Secondary | ICD-10-CM | POA: Diagnosis not present

## 2022-09-17 DIAGNOSIS — I700 Atherosclerosis of aorta: Secondary | ICD-10-CM | POA: Diagnosis not present

## 2022-09-17 DIAGNOSIS — I2510 Atherosclerotic heart disease of native coronary artery without angina pectoris: Secondary | ICD-10-CM | POA: Diagnosis not present

## 2022-09-17 DIAGNOSIS — J439 Emphysema, unspecified: Secondary | ICD-10-CM | POA: Diagnosis not present

## 2022-09-24 DIAGNOSIS — D6859 Other primary thrombophilia: Secondary | ICD-10-CM | POA: Diagnosis not present

## 2022-09-24 DIAGNOSIS — M810 Age-related osteoporosis without current pathological fracture: Secondary | ICD-10-CM | POA: Diagnosis not present

## 2022-10-13 DIAGNOSIS — H25813 Combined forms of age-related cataract, bilateral: Secondary | ICD-10-CM | POA: Diagnosis not present

## 2022-10-23 DIAGNOSIS — H269 Unspecified cataract: Secondary | ICD-10-CM | POA: Diagnosis not present

## 2022-10-23 DIAGNOSIS — H25812 Combined forms of age-related cataract, left eye: Secondary | ICD-10-CM | POA: Diagnosis not present

## 2022-10-27 DIAGNOSIS — Z1231 Encounter for screening mammogram for malignant neoplasm of breast: Secondary | ICD-10-CM | POA: Diagnosis not present

## 2022-11-06 DIAGNOSIS — H25011 Cortical age-related cataract, right eye: Secondary | ICD-10-CM | POA: Diagnosis not present

## 2022-11-06 DIAGNOSIS — H25811 Combined forms of age-related cataract, right eye: Secondary | ICD-10-CM | POA: Diagnosis not present

## 2022-11-06 DIAGNOSIS — H269 Unspecified cataract: Secondary | ICD-10-CM | POA: Diagnosis not present

## 2022-11-17 DIAGNOSIS — R9431 Abnormal electrocardiogram [ECG] [EKG]: Secondary | ICD-10-CM | POA: Diagnosis not present

## 2022-11-17 DIAGNOSIS — I509 Heart failure, unspecified: Secondary | ICD-10-CM | POA: Diagnosis not present

## 2022-11-17 DIAGNOSIS — I2510 Atherosclerotic heart disease of native coronary artery without angina pectoris: Secondary | ICD-10-CM | POA: Diagnosis not present

## 2022-11-17 DIAGNOSIS — Z7982 Long term (current) use of aspirin: Secondary | ICD-10-CM | POA: Diagnosis not present

## 2022-11-17 DIAGNOSIS — I110 Hypertensive heart disease with heart failure: Secondary | ICD-10-CM | POA: Diagnosis not present

## 2022-11-17 DIAGNOSIS — R001 Bradycardia, unspecified: Secondary | ICD-10-CM | POA: Diagnosis not present

## 2022-11-17 DIAGNOSIS — I4819 Other persistent atrial fibrillation: Secondary | ICD-10-CM | POA: Diagnosis not present

## 2022-11-17 DIAGNOSIS — I498 Other specified cardiac arrhythmias: Secondary | ICD-10-CM | POA: Diagnosis not present

## 2022-11-17 DIAGNOSIS — Z79899 Other long term (current) drug therapy: Secondary | ICD-10-CM | POA: Diagnosis not present

## 2022-11-17 DIAGNOSIS — I4811 Longstanding persistent atrial fibrillation: Secondary | ICD-10-CM | POA: Diagnosis not present

## 2022-11-17 DIAGNOSIS — I10 Essential (primary) hypertension: Secondary | ICD-10-CM | POA: Diagnosis not present

## 2022-11-21 DIAGNOSIS — I4811 Longstanding persistent atrial fibrillation: Secondary | ICD-10-CM | POA: Diagnosis not present

## 2022-11-22 DIAGNOSIS — R42 Dizziness and giddiness: Secondary | ICD-10-CM | POA: Diagnosis not present

## 2022-11-22 DIAGNOSIS — R030 Elevated blood-pressure reading, without diagnosis of hypertension: Secondary | ICD-10-CM | POA: Diagnosis not present

## 2022-11-22 DIAGNOSIS — R519 Headache, unspecified: Secondary | ICD-10-CM | POA: Diagnosis not present

## 2022-11-22 DIAGNOSIS — Z6828 Body mass index (BMI) 28.0-28.9, adult: Secondary | ICD-10-CM | POA: Diagnosis not present

## 2022-11-26 DIAGNOSIS — R519 Headache, unspecified: Secondary | ICD-10-CM | POA: Diagnosis not present

## 2022-11-26 DIAGNOSIS — R42 Dizziness and giddiness: Secondary | ICD-10-CM | POA: Diagnosis not present

## 2022-11-26 DIAGNOSIS — M810 Age-related osteoporosis without current pathological fracture: Secondary | ICD-10-CM | POA: Diagnosis not present

## 2022-11-26 DIAGNOSIS — Z8673 Personal history of transient ischemic attack (TIA), and cerebral infarction without residual deficits: Secondary | ICD-10-CM | POA: Diagnosis not present

## 2022-12-05 ENCOUNTER — Encounter: Payer: Self-pay | Admitting: Neurology

## 2022-12-15 DIAGNOSIS — R2681 Unsteadiness on feet: Secondary | ICD-10-CM | POA: Diagnosis not present

## 2022-12-15 DIAGNOSIS — M6281 Muscle weakness (generalized): Secondary | ICD-10-CM | POA: Diagnosis not present

## 2022-12-17 DIAGNOSIS — M6281 Muscle weakness (generalized): Secondary | ICD-10-CM | POA: Diagnosis not present

## 2022-12-17 DIAGNOSIS — R2681 Unsteadiness on feet: Secondary | ICD-10-CM | POA: Diagnosis not present

## 2022-12-18 ENCOUNTER — Ambulatory Visit (HOSPITAL_COMMUNITY)
Admission: RE | Admit: 2022-12-18 | Discharge: 2022-12-18 | Disposition: A | Discharge status: Home / Self Care | Payer: Medicare PPO | Source: Ambulatory Visit | Attending: Vascular Surgery | Admitting: Vascular Surgery

## 2022-12-18 ENCOUNTER — Ambulatory Visit (INDEPENDENT_AMBULATORY_CARE_PROVIDER_SITE_OTHER)
Admission: RE | Admit: 2022-12-18 | Discharge: 2022-12-18 | Disposition: A | Discharge status: Home / Self Care | Payer: Medicare PPO | Source: Ambulatory Visit | Attending: Vascular Surgery | Admitting: Vascular Surgery

## 2022-12-18 ENCOUNTER — Other Ambulatory Visit: Payer: Self-pay

## 2022-12-18 ENCOUNTER — Encounter: Payer: Self-pay | Admitting: Vascular Surgery

## 2022-12-18 ENCOUNTER — Ambulatory Visit: Payer: Medicare PPO | Admitting: Vascular Surgery

## 2022-12-18 VITALS — BP 158/72 | HR 79 | Temp 97.9°F | Resp 20 | Ht 61.0 in | Wt 142.0 lb

## 2022-12-18 DIAGNOSIS — I6529 Occlusion and stenosis of unspecified carotid artery: Secondary | ICD-10-CM | POA: Diagnosis not present

## 2022-12-18 DIAGNOSIS — I6522 Occlusion and stenosis of left carotid artery: Secondary | ICD-10-CM

## 2022-12-18 DIAGNOSIS — I7409 Other arterial embolism and thrombosis of abdominal aorta: Secondary | ICD-10-CM

## 2022-12-18 DIAGNOSIS — I70223 Atherosclerosis of native arteries of extremities with rest pain, bilateral legs: Secondary | ICD-10-CM | POA: Diagnosis not present

## 2022-12-18 DIAGNOSIS — G451 Carotid artery syndrome (hemispheric): Secondary | ICD-10-CM

## 2022-12-18 LAB — VAS US ABI WITH/WO TBI
Left ABI: 0.48
Right ABI: 0.31

## 2022-12-18 NOTE — Progress Notes (Signed)
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  MEDICAL ISSUES:   CRITICAL LEFT CAROTID STENOSIS: This patient has a critical left carotid stenosis that has progressed significantly since her study back in September.  Peak systolic velocity is 800 cm/s with an end-diastolic velocity of 248 cm/s suggesting a string sign.  In addition she has had repeated TIAs with right upper extremity weakness, right facial paresthesias, and some expressive aphasia.  Given that this is asymptomatic right carotid stenosis I have recommended right carotid endarterectomy in order to lower her risk of future stroke. Based on her duplex, this does appear to be surgically accessible and the stenosis is limited to the carotid bifurcation.  I have reviewed the indications for carotid endarterectomy, that is to lower the risk of future stroke. I have also reviewed the potential complications of surgery, including but not limited to: bleeding, stroke (perioperative risk 1-2%), MI, nerve injury of other unpredictable medical problems. All of the patients questions were answered and they are agreeable to proceed with surgery.  Her surgery is scheduled for 12/23/2022.  She knows to continue her aspirin right up through surgery.  She will hold her Eliquis for 48 hours prior to the procedure.  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  She quit smoking in September 2023.  I encouraged her to stay as active as possible.  We would only consider aortofemoral bypass grafting if her symptoms progressed significantly.  After her carotid endarterectomy we will arrange for a follow-up Doppler study on the lower extremities when she comes in for her follow-up carotid duplex scan.   HPI:   Angela Stein is a pleasant 72 y.o. female who had presented with bilateral lower extremity claudication.  On 04/11/2022 she underwent an arteriogram.  She was found to have severe  aortoiliac occlusive disease and her infrarenal aorta was heavily calcified.  At that time she had claudication but no critical limb ischemia.  She also had degenerative disc disease of her back and I felt that some of her leg symptoms could be attributed to this.  If her symptoms progressed she would need a CT angiogram to further evaluate the extent of her calcific disease.  She was set up for a follow-up visit.  Of note, I saw her last I also heard a left carotid bruit which prompted a carotid duplex scan.  This showed a 60 to 79% left carotid stenosis.  At that time she was asymptomatic.  I also arrange for a follow-up duplex at this time.  Since I saw her last, she does note that she has had multiple episodes of right arm weakness which lasted only a few seconds.  She thinks that over the last 3 to 4 months this has happened 5 times approximately.  The most recent episode was 2 weeks ago.  She also had an episode of transient numbness in her right face.  In addition she had an episode of expressive aphasia.  All the symptoms resolved quickly.  She is on Eliquis for atrial fibrillation.  She began taking aspirin after her arteriogram.  She is on 81 mg a day.  She is also on a statin.  She quit smoking in September of last year.  With respect to her peripheral arterial disease she does admit to stable bilateral calf claudication.  She states that she can walk 6 minutes before experiencing symptoms.  She also complains of some numbness in her legs  but this also occurs with sitting and standing and she does have degenerative disc disease of her back.  She denies any history of rest pain or nonhealing ulcers.  She does have a history of atrial fibrillation but denies any history of myocardial infarction or congestive heart failure.  She has had no recent chest pain.  She denies dyspnea on exertion.  She can walk up a flight of stairs without any significant shortness of breath.  Past Medical History:   Diagnosis Date   Atrial fibrillation [facility])    Carotid artery occlusion     History reviewed. No pertinent family history.  SOCIAL HISTORY: Social History   Tobacco Use   Smoking status: Former    Types: Cigarettes    Quit date: 04/2022    Years since quitting: 0.6   Smokeless tobacco: Never  Substance Use Topics   Alcohol use: Yes    Comment: Once a year    Allergies  Allergen Reactions   Ciprofloxacin Hives   Lisinopril Swelling    Current Outpatient Medications  Medication Sig Dispense Refill   amLODipine (NORVASC) 5 MG tablet Take 5 mg by mouth daily.     apixaban (ELIQUIS) 5 MG TABS tablet Take 1 tablet (5 mg total) by mouth 2 (two) times daily. 60 tablet    Calcium Carbonate (CALCIUM 600 PO) Take 600 mg by mouth 2 (two) times daily.     Cholecalciferol (VITAMIN D3) 25 MCG (1000 UT) CAPS Take 1,000 Units by mouth 2 (two) times daily.      dofetilide (TIKOSYN) 500 MCG capsule Take 500 mcg by mouth every 12 (twelve) hours.     furosemide (LASIX) 40 MG tablet Take 20 mg by mouth daily.      losartan (COZAAR) 50 MG tablet Take 50 mg by mouth 2 (two) times daily.     metoprolol tartrate (LOPRESSOR) 50 MG tablet Take 75 mg by mouth 2 (two) times daily.      pravastatin (PRAVACHOL) 20 MG tablet Take 1 tablet (20 mg total) by mouth daily. 30 tablet 2   saccharomyces boulardii (FLORASTOR) 250 MG capsule Take 250 mg by mouth daily.     valACYclovir (VALTREX) 1000 MG tablet Take 1,000 mg by mouth daily as needed (shingles/mouth ulcers).     No current facility-administered medications for this visit.    REVIEW OF SYSTEMS:  [X]  denotes positive finding, [ ]  denotes negative finding Cardiac  Comments:  Chest pain or chest pressure:    Shortness of breath upon exertion:    Short of breath when lying flat:    Irregular heart rhythm:        Vascular    Pain in calf, thigh, or hip brought on by ambulation: x   Pain in feet at night that wakes you up from your sleep:      Blood clot in your veins:    Leg swelling:         Pulmonary    Oxygen at home:    Productive cough:     Wheezing:         Neurologic    Sudden weakness in arms or legs:  x Right arm and right face  Sudden numbness in arms or legs:     Sudden onset of difficulty speaking or slurred speech: x   Temporary loss of vision in one eye:     Problems with dizziness:         Gastrointestinal    Blood in stool:  Vomited blood:         Genitourinary    Burning when urinating:     Blood in urine:        Psychiatric    Major depression:         Hematologic    Bleeding problems:    Problems with blood clotting too easily:        Skin    Rashes or ulcers:        Constitutional    Fever or chills:     PHYSICAL EXAM:   Vitals:   12/18/22 1529 12/18/22 1536  BP: (!) 166/84 (!) 158/72  Pulse: 79   Resp: 20   Temp: 97.9 F (36.6 C)   SpO2: 93%   Weight: 142 lb (64.4 kg)   Height: 5\' 1"  (1.549 m)     GENERAL: The patient is a well-nourished female, in no acute distress. The vital signs are documented above. CARDIAC: There is a regular rate and rhythm.  VASCULAR: She has bilateral carotid bruits. I cannot palpate femoral pulses. I cannot palpate pedal pulses. PULMONARY: There is good air exchange bilaterally without wheezing or rales. ABDOMEN: Soft and non-tender with normal pitched bowel sounds.  MUSCULOSKELETAL: There are no major deformities or cyanosis. NEUROLOGIC: No focal weakness or paresthesias are detected. SKIN: There are no ulcers or rashes noted. PSYCHIATRIC: The patient has a normal affect.  DATA:    CAROTID DUPLEX: I have independently interpreted her carotid duplex scan today.  On the right side there is a less than 39% stenosis.  The right vertebral artery is patent with antegrade flow.  On the left side there is a greater than 80% stenosis.  Peak systolic velocity is 800 cm/s with an end-diastolic velocity of 248 cm/s.  This suggests a critical  stenosis.  The left vertebral artery is patent with antegrade flow.  Of note, in terms of surgical assessment, the stenosis on the left is at the carotid bifurcation only.  The artery appears normal past the stenosis.  The bifurcation is located near the hyoid notch.  Of note and the report it says right but I discussed this with the technician and this was the left side that was evaluated for surgical landmarks.  ARTERIAL DOPPLER STUDY: I have independently interpreted her arterial Doppler study today.  On the right side there is a monophasic posterior tibial and dorsalis pedis signal.  ABIs 31%.  On the left side there is a biphasic posterior tibial signal with a monophasic dorsalis pedis signal.  ABI is 48%.  Waverly Ferrari [facility] [phone]

## 2022-12-18 NOTE — H&P (View-Only) (Signed)
  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  MEDICAL ISSUES:   CRITICAL LEFT CAROTID STENOSIS: This patient has a critical left carotid stenosis that has progressed significantly since her study back in September.  Peak systolic velocity is 800 cm/s with an end-diastolic velocity of 248 cm/s suggesting a string sign.  In addition she has had repeated TIAs with right upper extremity weakness, right facial paresthesias, and some expressive aphasia.  Given that this is asymptomatic right carotid stenosis I have recommended right carotid endarterectomy in order to lower her risk of future stroke. Based on her duplex, this does appear to be surgically accessible and the stenosis is limited to the carotid bifurcation.  I have reviewed the indications for carotid endarterectomy, that is to lower the risk of future stroke. I have also reviewed the potential complications of surgery, including but not limited to: bleeding, stroke (perioperative risk 1-2%), MI, nerve injury of other unpredictable medical problems. All of the patients questions were answered and they are agreeable to proceed with surgery.  Her surgery is scheduled for 12/23/2022.  She knows to continue her aspirin right up through surgery.  She will hold her Eliquis for 48 hours prior to the procedure.  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  She quit smoking in September 2023.  I encouraged her to stay as active as possible.  We would only consider aortofemoral bypass grafting if her symptoms progressed significantly.  After her carotid endarterectomy we will arrange for a follow-up Doppler study on the lower extremities when she comes in for her follow-up carotid duplex scan.   HPI:   Angela Stein is a pleasant 72 y.o. female who had presented with bilateral lower extremity claudication.  On 04/11/2022 she underwent an arteriogram.  She was found to have severe  aortoiliac occlusive disease and her infrarenal aorta was heavily calcified.  At that time she had claudication but no critical limb ischemia.  She also had degenerative disc disease of her back and I felt that some of her leg symptoms could be attributed to this.  If her symptoms progressed she would need a CT angiogram to further evaluate the extent of her calcific disease.  She was set up for a follow-up visit.  Of note, I saw her last I also heard a left carotid bruit which prompted a carotid duplex scan.  This showed a 60 to 79% left carotid stenosis.  At that time she was asymptomatic.  I also arrange for a follow-up duplex at this time.  Since I saw her last, she does note that she has had multiple episodes of right arm weakness which lasted only a few seconds.  She thinks that over the last 3 to 4 months this has happened 5 times approximately.  The most recent episode was 2 weeks ago.  She also had an episode of transient numbness in her right face.  In addition she had an episode of expressive aphasia.  All the symptoms resolved quickly.  She is on Eliquis for atrial fibrillation.  She began taking aspirin after her arteriogram.  She is on 81 mg a day.  She is also on a statin.  She quit smoking in September of last year.  With respect to her peripheral arterial disease she does admit to stable bilateral calf claudication.  She states that she can walk 6 minutes before experiencing symptoms.  She also complains of some numbness in her legs   but this also occurs with sitting and standing and she does have degenerative disc disease of her back.  She denies any history of rest pain or nonhealing ulcers.  She does have a history of atrial fibrillation but denies any history of myocardial infarction or congestive heart failure.  She has had no recent chest pain.  She denies dyspnea on exertion.  She can walk up a flight of stairs without any significant shortness of breath.  Past Medical History:   Diagnosis Date   Atrial fibrillation (HCC)    Carotid artery occlusion     History reviewed. No pertinent family history.  SOCIAL HISTORY: Social History   Tobacco Use   Smoking status: Former    Types: Cigarettes    Quit date: 04/2022    Years since quitting: 0.6   Smokeless tobacco: Never  Substance Use Topics   Alcohol use: Yes    Comment: Once a year    Allergies  Allergen Reactions   Ciprofloxacin Hives   Lisinopril Swelling    Current Outpatient Medications  Medication Sig Dispense Refill   amLODipine (NORVASC) 5 MG tablet Take 5 mg by mouth daily.     apixaban (ELIQUIS) 5 MG TABS tablet Take 1 tablet (5 mg total) by mouth 2 (two) times daily. 60 tablet    Calcium Carbonate (CALCIUM 600 PO) Take 600 mg by mouth 2 (two) times daily.     Cholecalciferol (VITAMIN D3) 25 MCG (1000 UT) CAPS Take 1,000 Units by mouth 2 (two) times daily.      dofetilide (TIKOSYN) 500 MCG capsule Take 500 mcg by mouth every 12 (twelve) hours.     furosemide (LASIX) 40 MG tablet Take 20 mg by mouth daily.      losartan (COZAAR) 50 MG tablet Take 50 mg by mouth 2 (two) times daily.     metoprolol tartrate (LOPRESSOR) 50 MG tablet Take 75 mg by mouth 2 (two) times daily.      pravastatin (PRAVACHOL) 20 MG tablet Take 1 tablet (20 mg total) by mouth daily. 30 tablet 2   saccharomyces boulardii (FLORASTOR) 250 MG capsule Take 250 mg by mouth daily.     valACYclovir (VALTREX) 1000 MG tablet Take 1,000 mg by mouth daily as needed (shingles/mouth ulcers).     No current facility-administered medications for this visit.    REVIEW OF SYSTEMS:  [X] denotes positive finding, [ ] denotes negative finding Cardiac  Comments:  Chest pain or chest pressure:    Shortness of breath upon exertion:    Short of breath when lying flat:    Irregular heart rhythm:        Vascular    Pain in calf, thigh, or hip brought on by ambulation: x   Pain in feet at night that wakes you up from your sleep:      Blood clot in your veins:    Leg swelling:         Pulmonary    Oxygen at home:    Productive cough:     Wheezing:         Neurologic    Sudden weakness in arms or legs:  x Right arm and right face  Sudden numbness in arms or legs:     Sudden onset of difficulty speaking or slurred speech: x   Temporary loss of vision in one eye:     Problems with dizziness:         Gastrointestinal    Blood in stool:       Vomited blood:         Genitourinary    Burning when urinating:     Blood in urine:        Psychiatric    Major depression:         Hematologic    Bleeding problems:    Problems with blood clotting too easily:        Skin    Rashes or ulcers:        Constitutional    Fever or chills:     PHYSICAL EXAM:   Vitals:   12/18/22 1529 12/18/22 1536  BP: (!) 166/84 (!) 158/72  Pulse: 79   Resp: 20   Temp: 97.9 F (36.6 C)   SpO2: 93%   Weight: 142 lb (64.4 kg)   Height: 5' 1" (1.549 m)     GENERAL: The patient is a well-nourished female, in no acute distress. The vital signs are documented above. CARDIAC: There is a regular rate and rhythm.  VASCULAR: She has bilateral carotid bruits. I cannot palpate femoral pulses. I cannot palpate pedal pulses. PULMONARY: There is good air exchange bilaterally without wheezing or rales. ABDOMEN: Soft and non-tender with normal pitched bowel sounds.  MUSCULOSKELETAL: There are no major deformities or cyanosis. NEUROLOGIC: No focal weakness or paresthesias are detected. SKIN: There are no ulcers or rashes noted. PSYCHIATRIC: The patient has a normal affect.  DATA:    CAROTID DUPLEX: I have independently interpreted her carotid duplex scan today.  On the right side there is a less than 39% stenosis.  The right vertebral artery is patent with antegrade flow.  On the left side there is a greater than 80% stenosis.  Peak systolic velocity is 800 cm/s with an end-diastolic velocity of 248 cm/s.  This suggests a critical  stenosis.  The left vertebral artery is patent with antegrade flow.  Of note, in terms of surgical assessment, the stenosis on the left is at the carotid bifurcation only.  The artery appears normal past the stenosis.  The bifurcation is located near the hyoid notch.  Of note and the report it says right but I discussed this with the technician and this was the left side that was evaluated for surgical landmarks.  ARTERIAL DOPPLER STUDY: I have independently interpreted her arterial Doppler study today.  On the right side there is a monophasic posterior tibial and dorsalis pedis signal.  ABIs 31%.  On the left side there is a biphasic posterior tibial signal with a monophasic dorsalis pedis signal.  ABI is 48%.  Angela Stein [facility] [place] Office [phone] 

## 2022-12-19 ENCOUNTER — Other Ambulatory Visit: Payer: Self-pay

## 2022-12-19 ENCOUNTER — Encounter (HOSPITAL_COMMUNITY): Payer: Self-pay | Admitting: Vascular Surgery

## 2022-12-19 NOTE — Progress Notes (Signed)
PCP - Higinio Plan Cardiologist - Orlean Bradford  EKG - 04/21/2022 Stress Test- 07/01/2018 Chest x-ray - denies ECHO - 07/01/2018 Cardiac Cath - 04/11/2022  Sleep Study-Denies   DM- Denies   Blood Thinner Instructions: Per MD letter hold Eliquis for 2 days.  Last dose 5/11/ Aspirin Instructions: Per MD Letter to continue.   ERAS Protcol - N/A.  NPO ordered COVID TEST- N/A  Anesthesia review: Yes, cardiac hx  -------------  SDW INSTRUCTIONS:  Your procedure is scheduled on Tuesday May 14 th. Please report to [facility] Main Entrance "A" at 0530 A.M., and check in at the Admitting office. Call this number if you have problems the morning of surgery: [phone]   Remember: Do not eat or drink after midnight the night before your surgery   Medications to take morning of surgery with a sip of water include: amLODipine (NORVASC)  aspirin EC 81 dofetilide (TIKOSYN)   ezetimibe (ZETIA)  metoprolol tartrate (LOPRESSOR)  pravastatin (PRAVACHOL)   IF NEEDED valACYclovir (VALTREX)    As of today, STOP taking any Aspirin (unless otherwise instructed by your surgeon), Aleve, Naproxen, Ibuprofen, Motrin, Advil, Goody's, BC's, all herbal medications, fish oil, and all vitamins.    The Morning of Surgery Do not wear jewelry, make-up or nail polish. Do not wear lotions, powders, or perfumes/colognes, or deodorant Do not bring valuables to the hospital. [facility] is not responsible for any belongings or valuables.  If you are a smoker, DO NOT Smoke 24 hours prior to surgery  If you wear a CPAP at night please bring your mask the morning of surgery   Remember that you must have someone to transport you home after your surgery, and remain with you for 24 hours if you are discharged the same day.  Please bring cases for contacts, glasses, hearing aids, dentures or bridgework because it cannot be worn into surgery.   Patients discharged the day of surgery will not be  allowed to drive home.   Please shower the NIGHT BEFORE/MORNING OF SURGERY (use antibacterial soap like DIAL soap if possible). Wear comfortable clothes the morning of surgery. Oral Hygiene is also important to reduce your risk of infection.  Remember - BRUSH YOUR TEETH THE MORNING OF SURGERY WITH YOUR REGULAR TOOTHPASTE  Patient denies shortness of breath, fever, cough and chest pain.

## 2022-12-22 DIAGNOSIS — R2681 Unsteadiness on feet: Secondary | ICD-10-CM | POA: Diagnosis not present

## 2022-12-22 DIAGNOSIS — M6281 Muscle weakness (generalized): Secondary | ICD-10-CM | POA: Diagnosis not present

## 2022-12-22 NOTE — Progress Notes (Signed)
Anesthesia Chart Review: Same day workup  Patient follows with cardiology at [facility] for history of HTN and persistent atrial fibrillation s/p ablation 2020, maintained on Eliquis Tikosyn, metoprolol.  Last seen 11/17/2022.  Currently maintaining sinus rhythm.  Walking 1 to 2 miles per day.  No changes to management.  22-month follow-up recommended.  Last dose Eliquis 12/20/2022.  Patient will need day of surgery labs and evaluation.  EKG 11/17/2022 (Care Everywhere): Sinus bradycardia with sinus arrhythmia.  Rate 59.  Rightward axis.  Prolonged QT (QTc 47).  No significant change.  Carotid duplex 12/18/2022: Summary:  Right Carotid: Velocities in the right ICA are consistent with a 1-39% stenosis.  Left Carotid: Velocities in the left ICA are consistent with a 80-99% stenosis. The ECA appears >50% stenosed.  Vertebrals:  Bilateral vertebral arteries demonstrate antegrade flow.  Subclavians: Normal flow hemodynamics were seen in bilateral subclavian arteries.   Low-dose CT chest 09/17/2022: 1. Lung-RADS 2, benign appearance or behavior. Continue annual  screening with low-dose chest CT without contrast in 12 months.  2. Coronary artery calcifications, aortic Atherosclerosis  (ICD10-I70.0) and Emphysema (ICD10-J43.9).   Nuclear stress 07/01/2018 (Care Everywhere): - Probably normal myocardial perfusion study  - Apical defect seen on attenuation corrected images disappears without  attenuation correction indicating likley artifact  - Post stress: Global systolic function is normal. EF at 58%   - No evidence for significant ischemia or scar is noted.  - Aortic and mild coronary calcifications noted  - Sensitivity and specificity of this test are reduced by the noted  attenuation from arms at side    Zannie Cove [facility] Short Stay Center/Anesthesiology Phone [phone] 12/22/2022 10:26 AM

## 2022-12-22 NOTE — Anesthesia Preprocedure Evaluation (Signed)
Anesthesia Evaluation  Patient identified by MRN, date of birth, ID band Patient awake    Reviewed: Allergy & Precautions, NPO status , Patient's Chart, lab work & pertinent test results  Airway Mallampati: II  TM Distance: >3 FB Neck ROM: Full    Dental no notable dental hx.    Pulmonary former smoker   Pulmonary exam normal        Cardiovascular hypertension, Pt. on medications and Pt. on home beta blockers + CAD  Normal cardiovascular exam+ dysrhythmias Atrial Fibrillation      Neuro/Psych TIA negative psych ROS   GI/Hepatic negative GI ROS, Neg liver ROS,,,  Endo/Other  negative endocrine ROS    Renal/GU negative Renal ROS     Musculoskeletal negative musculoskeletal ROS (+)    Abdominal   Peds  Hematology  (+) Blood dyscrasia (Eliquis), anemia   Anesthesia Other Findings Symptomatic left carotid artery stenosis TIA  Reproductive/Obstetrics                             Anesthesia Physical Anesthesia Plan  ASA: 4  Anesthesia Plan: General   Post-op Pain Management:    Induction: Intravenous  PONV Risk Score and Plan: 3 and Ondansetron, Dexamethasone and Treatment may vary due to age or medical condition  Airway Management Planned: Oral ETT  Additional Equipment: Arterial line  Intra-op Plan:   Post-operative Plan: Extubation in OR  Informed Consent: I have reviewed the patients History and Physical, chart, labs and discussed the procedure including the risks, benefits and alternatives for the proposed anesthesia with the patient or authorized representative who has indicated his/her understanding and acceptance.     Dental advisory given  Plan Discussed with: CRNA  Anesthesia Plan Comments: (PAT note by Antionette Poles, PA-C: Patient follows with cardiology at [facility] for history of HTN and persistent atrial fibrillation s/p ablation 2020, maintained on Eliquis Tikosyn,  metoprolol.  Last seen 11/17/2022.  Currently maintaining sinus rhythm.  Walking 1 to 2 miles per day.  No changes to management.  24-month follow-up recommended.  Last dose Eliquis 12/20/2022.  Patient will need day of surgery labs and evaluation.  EKG 11/17/2022 (Care Everywhere): Sinus bradycardia with sinus arrhythmia.  Rate 59.  Rightward axis.  Prolonged QT (QTc 47).  No significant change.  Carotid duplex 12/18/2022: Summary:  Right Carotid: Velocities in the right ICA are consistent with a 1-39% stenosis.  Left Carotid: Velocities in the left ICA are consistent with a 80-99% stenosis. The ECA appears >50% stenosed.  Vertebrals: Bilateral vertebral arteries demonstrate antegrade flow.  Subclavians: Normal flow hemodynamics were seen in bilateral subclavian arteries.   Low-dose CT chest 09/17/2022: 1. Lung-RADS 2, benign appearance or behavior. Continue annual  screening with low-dose chest CT without contrast in 12 months.  2. Coronary artery calcifications, aortic Atherosclerosis  (ICD10-I70.0) and Emphysema (ICD10-J43.9).   Nuclear stress 07/01/2018 (Care Everywhere): - Probably normal myocardial perfusion study  - Apical defect seen on attenuation corrected images disappears without  attenuation correction indicating likley artifact  - Post stress: Global systolic function is normal. EF at 58%   - No evidence for significant ischemia or scar is noted.  - Aortic and mild coronary calcifications noted  - Sensitivity and specificity of this test are reduced by the noted  attenuation from arms at side   )        Anesthesia Quick Evaluation

## 2022-12-23 ENCOUNTER — Inpatient Hospital Stay (HOSPITAL_COMMUNITY): Payer: Medicare PPO | Admitting: Physician Assistant

## 2022-12-23 ENCOUNTER — Encounter (HOSPITAL_COMMUNITY): Payer: Self-pay | Admitting: Vascular Surgery

## 2022-12-23 ENCOUNTER — Other Ambulatory Visit: Payer: Self-pay

## 2022-12-23 ENCOUNTER — Inpatient Hospital Stay (HOSPITAL_COMMUNITY)
Admission: RE | Admit: 2022-12-23 | Discharge: 2022-12-24 | DRG: 038 | Disposition: A | Discharge status: Home / Self Care | Payer: Medicare PPO | Attending: Vascular Surgery | Admitting: Vascular Surgery

## 2022-12-23 ENCOUNTER — Encounter (HOSPITAL_COMMUNITY)
Admission: RE | Disposition: A | Discharge status: Home / Self Care | Payer: Self-pay | Source: Home / Self Care | Attending: Vascular Surgery

## 2022-12-23 DIAGNOSIS — Z7982 Long term (current) use of aspirin: Secondary | ICD-10-CM

## 2022-12-23 DIAGNOSIS — J439 Emphysema, unspecified: Secondary | ICD-10-CM | POA: Diagnosis not present

## 2022-12-23 DIAGNOSIS — I4819 Other persistent atrial fibrillation: Secondary | ICD-10-CM | POA: Diagnosis present

## 2022-12-23 DIAGNOSIS — Z888 Allergy status to other drugs, medicaments and biological substances status: Secondary | ICD-10-CM

## 2022-12-23 DIAGNOSIS — I6522 Occlusion and stenosis of left carotid artery: Secondary | ICD-10-CM | POA: Diagnosis not present

## 2022-12-23 DIAGNOSIS — I10 Essential (primary) hypertension: Secondary | ICD-10-CM

## 2022-12-23 DIAGNOSIS — I2510 Atherosclerotic heart disease of native coronary artery without angina pectoris: Secondary | ICD-10-CM

## 2022-12-23 DIAGNOSIS — Z8673 Personal history of transient ischemic attack (TIA), and cerebral infarction without residual deficits: Secondary | ICD-10-CM

## 2022-12-23 DIAGNOSIS — Z87891 Personal history of nicotine dependence: Secondary | ICD-10-CM

## 2022-12-23 DIAGNOSIS — Z881 Allergy status to other antibiotic agents status: Secondary | ICD-10-CM

## 2022-12-23 DIAGNOSIS — I7409 Other arterial embolism and thrombosis of abdominal aorta: Secondary | ICD-10-CM | POA: Diagnosis not present

## 2022-12-23 DIAGNOSIS — Z7901 Long term (current) use of anticoagulants: Secondary | ICD-10-CM | POA: Diagnosis not present

## 2022-12-23 DIAGNOSIS — Z9889 Other specified postprocedural states: Principal | ICD-10-CM

## 2022-12-23 DIAGNOSIS — I700 Atherosclerosis of aorta: Secondary | ICD-10-CM | POA: Diagnosis present

## 2022-12-23 DIAGNOSIS — Z79899 Other long term (current) drug therapy: Secondary | ICD-10-CM

## 2022-12-23 DIAGNOSIS — I70213 Atherosclerosis of native arteries of extremities with intermittent claudication, bilateral legs: Secondary | ICD-10-CM | POA: Diagnosis present

## 2022-12-23 DIAGNOSIS — G451 Carotid artery syndrome (hemispheric): Secondary | ICD-10-CM

## 2022-12-23 HISTORY — PX: ENDARTERECTOMY: SHX5162

## 2022-12-23 HISTORY — DX: Atherosclerotic heart disease of native coronary artery without angina pectoris: I25.10

## 2022-12-23 HISTORY — DX: Cardiac arrhythmia, unspecified: I49.9

## 2022-12-23 HISTORY — DX: Essential (primary) hypertension: I10

## 2022-12-23 HISTORY — PX: PATCH ANGIOPLASTY: SHX6230

## 2022-12-23 LAB — APTT: aPTT: 30 seconds (ref 24–36)

## 2022-12-23 LAB — CBC
HCT: 33.9 % — ABNORMAL LOW (ref 36.0–46.0)
Hemoglobin: 11 g/dL — ABNORMAL LOW (ref 12.0–15.0)
MCH: 28.1 pg (ref 26.0–34.0)
MCHC: 32.4 g/dL (ref 30.0–36.0)
MCV: 86.5 fL (ref 80.0–100.0)
Platelets: 270 10*3/uL (ref 150–400)
RBC: 3.92 MIL/uL (ref 3.87–5.11)
RDW: 15.6 % — ABNORMAL HIGH (ref 11.5–15.5)
WBC: 8.1 10*3/uL (ref 4.0–10.5)
nRBC: 0 % (ref 0.0–0.2)

## 2022-12-23 LAB — PROTIME-INR
INR: 1 (ref 0.8–1.2)
Prothrombin Time: 13.4 seconds (ref 11.4–15.2)

## 2022-12-23 LAB — COMPREHENSIVE METABOLIC PANEL
ALT: 15 U/L (ref 0–44)
AST: 17 U/L (ref 15–41)
Albumin: 3.4 g/dL — ABNORMAL LOW (ref 3.5–5.0)
Alkaline Phosphatase: 75 U/L (ref 38–126)
Anion gap: 11 (ref 5–15)
BUN: 13 mg/dL (ref 8–23)
CO2: 24 mmol/L (ref 22–32)
Calcium: 8.6 mg/dL — ABNORMAL LOW (ref 8.9–10.3)
Chloride: 101 mmol/L (ref 98–111)
Creatinine, Ser: 0.72 mg/dL (ref 0.44–1.00)
GFR, Estimated: >60 mL/min (ref >60)
Glucose, Bld: 113 mg/dL — ABNORMAL HIGH (ref 70–99)
Potassium: 3.5 mmol/L (ref 3.5–5.1)
Sodium: 136 mmol/L (ref 135–145)
Total Bilirubin: 0.4 mg/dL (ref 0.3–1.2)
Total Protein: 6.8 g/dL (ref 6.5–8.1)

## 2022-12-23 LAB — TYPE AND SCREEN
ABO/RH(D): O POS
Antibody Screen: NEGATIVE

## 2022-12-23 LAB — ABO/RH: ABO/RH(D): O POS

## 2022-12-23 LAB — SURGICAL PCR SCREEN
MRSA, PCR: NEGATIVE
Staphylococcus aureus: NEGATIVE

## 2022-12-23 SURGERY — ENDARTERECTOMY, CAROTID
Anesthesia: General | Site: Neck | Laterality: Left

## 2022-12-23 MED ORDER — ROCURONIUM BROMIDE 10 MG/ML (PF) SYRINGE
PREFILLED_SYRINGE | INTRAVENOUS | Status: DC | PRN
  Administered 2022-12-23 (×2): 50 mg via INTRAVENOUS

## 2022-12-23 MED ORDER — LACTATED RINGERS IV SOLN: INTRAVENOUS | Status: DC | PRN

## 2022-12-23 MED ORDER — ONDANSETRON HCL 4 MG/2ML IJ SOLN
INTRAMUSCULAR | Status: DC | PRN
  Administered 2022-12-23: 4 mg via INTRAVENOUS

## 2022-12-23 MED ORDER — DOFETILIDE 500 MCG PO CAPS
500.0000 ug | ORAL_CAPSULE | Freq: Two times a day (BID) | ORAL | Status: DC
  Administered 2022-12-23 – 2022-12-24 (×2): 500 ug via ORAL
  Filled 2022-12-23 (×2): qty 1

## 2022-12-23 MED ORDER — PROPOFOL 10 MG/ML IV BOLUS
INTRAVENOUS | Status: AC
  Filled 2022-12-23: qty 20

## 2022-12-23 MED ORDER — LIDOCAINE-EPINEPHRINE (PF) 1 %-1:200000 IJ SOLN
INTRAMUSCULAR | Status: DC | PRN
  Administered 2022-12-23: 10 mL via INTRADERMAL

## 2022-12-23 MED ORDER — DOCUSATE SODIUM 100 MG PO CAPS
100.0000 mg | ORAL_CAPSULE | Freq: Every day | ORAL | Status: DC
  Filled 2022-12-23: qty 1

## 2022-12-23 MED ORDER — SUGAMMADEX SODIUM 200 MG/2ML IV SOLN
INTRAVENOUS | Status: DC | PRN
  Administered 2022-12-23: 200 mg via INTRAVENOUS
  Administered 2022-12-23: 50 mg via INTRAVENOUS

## 2022-12-23 MED ORDER — PHENYLEPHRINE 80 MCG/ML (10ML) SYRINGE FOR IV PUSH (FOR BLOOD PRESSURE SUPPORT)
PREFILLED_SYRINGE | INTRAVENOUS | Status: AC
  Filled 2022-12-23: qty 10

## 2022-12-23 MED ORDER — LABETALOL HCL 5 MG/ML IV SOLN: 10.0000 mg | INTRAVENOUS | Status: DC | PRN

## 2022-12-23 MED ORDER — SODIUM CHLORIDE 0.9 % IV SOLN: 500.0000 mL | Freq: Once | INTRAVENOUS | Status: DC | PRN

## 2022-12-23 MED ORDER — ALUM & MAG HYDROXIDE-SIMETH 200-200-20 MG/5ML PO SUSP: 15.0000 mL | ORAL | Status: DC | PRN

## 2022-12-23 MED ORDER — LOSARTAN POTASSIUM 50 MG PO TABS
50.0000 mg | ORAL_TABLET | Freq: Two times a day (BID) | ORAL | Status: DC
  Administered 2022-12-23 – 2022-12-24 (×3): 50 mg via ORAL
  Filled 2022-12-23 (×3): qty 1

## 2022-12-23 MED ORDER — AMISULPRIDE (ANTIEMETIC) 5 MG/2ML IV SOLN
10.0000 mg | Freq: Once | INTRAVENOUS | Status: AC | PRN
  Administered 2022-12-23: 10 mg via INTRAVENOUS

## 2022-12-23 MED ORDER — POTASSIUM CHLORIDE CRYS ER 20 MEQ PO TBCR
40.0000 meq | EXTENDED_RELEASE_TABLET | Freq: Once | ORAL | Status: AC
  Administered 2022-12-23: 40 meq via ORAL
  Filled 2022-12-23: qty 2

## 2022-12-23 MED ORDER — DEXTRAN 40 IN SALINE 10-0.9 % IV SOLN
INTRAVENOUS | Status: AC | PRN
  Administered 2022-12-23: 500 mL

## 2022-12-23 MED ORDER — HYDRALAZINE HCL 20 MG/ML IJ SOLN
5.0000 mg | INTRAMUSCULAR | Status: DC | PRN
  Administered 2022-12-23: 5 mg via INTRAVENOUS

## 2022-12-23 MED ORDER — ONDANSETRON HCL 4 MG/2ML IJ SOLN: 4.0000 mg | Freq: Four times a day (QID) | INTRAMUSCULAR | Status: DC | PRN

## 2022-12-23 MED ORDER — DOFETILIDE 500 MCG PO CAPS
500.0000 ug | ORAL_CAPSULE | Freq: Two times a day (BID) | ORAL | Status: DC
  Filled 2022-12-23: qty 1

## 2022-12-23 MED ORDER — CEFAZOLIN SODIUM-DEXTROSE 2-4 GM/100ML-% IV SOLN
2.0000 g | INTRAVENOUS | Status: AC
  Administered 2022-12-23: 2 g via INTRAVENOUS

## 2022-12-23 MED ORDER — AMIODARONE HCL IN DEXTROSE 360-4.14 MG/200ML-% IV SOLN
INTRAVENOUS | Status: DC | PRN
  Administered 2022-12-23: 30 mg/h via INTRAVENOUS

## 2022-12-23 MED ORDER — PHENOL 1.4 % MT LIQD: 1.0000 | OROMUCOSAL | Status: DC | PRN

## 2022-12-23 MED ORDER — 0.9 % SODIUM CHLORIDE (POUR BTL) OPTIME
TOPICAL | Status: DC | PRN
  Administered 2022-12-23: 1000 mL

## 2022-12-23 MED ORDER — PROPOFOL 10 MG/ML IV BOLUS
INTRAVENOUS | Status: DC | PRN
  Administered 2022-12-23: 30 mg via INTRAVENOUS
  Administered 2022-12-23: 20 mg via INTRAVENOUS
  Administered 2022-12-23: 100 mg via INTRAVENOUS
  Administered 2022-12-23: 20 mg via INTRAVENOUS

## 2022-12-23 MED ORDER — ROCURONIUM BROMIDE 10 MG/ML (PF) SYRINGE
PREFILLED_SYRINGE | INTRAVENOUS | Status: AC
  Filled 2022-12-23: qty 10

## 2022-12-23 MED ORDER — PHENYLEPHRINE HCL-NACL 20-0.9 MG/250ML-% IV SOLN
INTRAVENOUS | Status: DC | PRN
  Administered 2022-12-23: 50 ug/min via INTRAVENOUS

## 2022-12-23 MED ORDER — EZETIMIBE 10 MG PO TABS
10.0000 mg | ORAL_TABLET | Freq: Every evening | ORAL | Status: DC
  Administered 2022-12-23: 10 mg via ORAL
  Filled 2022-12-23: qty 1

## 2022-12-23 MED ORDER — FENTANYL CITRATE (PF) 250 MCG/5ML IJ SOLN
INTRAMUSCULAR | Status: DC | PRN
  Administered 2022-12-23: 50 ug via INTRAVENOUS

## 2022-12-23 MED ORDER — HEPARIN SODIUM (PORCINE) 1000 UNIT/ML IJ SOLN
INTRAMUSCULAR | Status: DC | PRN
  Administered 2022-12-23: 6500 [IU] via INTRAVENOUS

## 2022-12-23 MED ORDER — HEMOSTATIC AGENTS (NO CHARGE) OPTIME
TOPICAL | Status: DC | PRN
  Administered 2022-12-23: 1 via TOPICAL

## 2022-12-23 MED ORDER — ACETAMINOPHEN 10 MG/ML IV SOLN
INTRAVENOUS | Status: AC
  Filled 2022-12-23: qty 100

## 2022-12-23 MED ORDER — SODIUM CHLORIDE 0.9 % IV SOLN
0.0125 ug/kg/min | INTRAVENOUS | Status: AC
  Administered 2022-12-23: .2 ug/kg/min via INTRAVENOUS
  Filled 2022-12-23: qty 2000

## 2022-12-23 MED ORDER — ESMOLOL HCL 100 MG/10ML IV SOLN
INTRAVENOUS | Status: DC | PRN
  Administered 2022-12-23: 30 ug via INTRAVENOUS
  Administered 2022-12-23: 50 ug via INTRAVENOUS
  Administered 2022-12-23: 20 ug via INTRAVENOUS

## 2022-12-23 MED ORDER — DEXAMETHASONE SODIUM PHOSPHATE 10 MG/ML IJ SOLN
INTRAMUSCULAR | Status: AC
  Filled 2022-12-23: qty 1

## 2022-12-23 MED ORDER — LACTATED RINGERS IV SOLN: INTRAVENOUS | Status: DC

## 2022-12-23 MED ORDER — LIDOCAINE HCL (PF) 1 % IJ SOLN
INTRAMUSCULAR | Status: AC
  Filled 2022-12-23: qty 5

## 2022-12-23 MED ORDER — ASPIRIN 81 MG PO TBEC
81.0000 mg | DELAYED_RELEASE_TABLET | Freq: Every evening | ORAL | Status: DC
  Administered 2022-12-23: 81 mg via ORAL
  Filled 2022-12-23: qty 1

## 2022-12-23 MED ORDER — POTASSIUM CHLORIDE CRYS ER 20 MEQ PO TBCR: 20.0000 meq | EXTENDED_RELEASE_TABLET | Freq: Every day | ORAL | Status: DC | PRN

## 2022-12-23 MED ORDER — CHLORHEXIDINE GLUCONATE CLOTH 2 % EX PADS: 6.0000 | MEDICATED_PAD | Freq: Once | CUTANEOUS | Status: DC

## 2022-12-23 MED ORDER — ACETAMINOPHEN 325 MG RE SUPP: 325.0000 mg | RECTAL | Status: DC | PRN

## 2022-12-23 MED ORDER — METOPROLOL TARTRATE 50 MG PO TABS
75.0000 mg | ORAL_TABLET | Freq: Two times a day (BID) | ORAL | Status: DC
  Administered 2022-12-23 – 2022-12-24 (×2): 75 mg via ORAL
  Filled 2022-12-23 (×2): qty 1

## 2022-12-23 MED ORDER — PRAVASTATIN SODIUM 10 MG PO TABS
20.0000 mg | ORAL_TABLET | Freq: Every day | ORAL | Status: DC
  Filled 2022-12-23 (×2): qty 2

## 2022-12-23 MED ORDER — LIDOCAINE 2% (20 MG/ML) 5 ML SYRINGE
INTRAMUSCULAR | Status: DC | PRN
  Administered 2022-12-23: 60 mg via INTRAVENOUS
  Administered 2022-12-23: 40 mg via INTRAVENOUS

## 2022-12-23 MED ORDER — OXYCODONE-ACETAMINOPHEN 5-325 MG PO TABS: 1.0000 | ORAL_TABLET | ORAL | Status: DC | PRN

## 2022-12-23 MED ORDER — MORPHINE SULFATE (PF) 2 MG/ML IV SOLN: 2.0000 mg | INTRAVENOUS | Status: DC | PRN

## 2022-12-23 MED ORDER — ONDANSETRON HCL 4 MG/2ML IJ SOLN: 4.0000 mg | Freq: Once | INTRAMUSCULAR | Status: DC | PRN

## 2022-12-23 MED ORDER — FUROSEMIDE 20 MG PO TABS: 20.0000 mg | ORAL_TABLET | Freq: Every day | ORAL | Status: DC | PRN

## 2022-12-23 MED ORDER — POLYETHYLENE GLYCOL 3350 17 G PO PACK: 17.0000 g | PACK | Freq: Every day | ORAL | Status: DC | PRN

## 2022-12-23 MED ORDER — ONDANSETRON HCL 4 MG/2ML IJ SOLN
INTRAMUSCULAR | Status: AC
  Filled 2022-12-23: qty 2

## 2022-12-23 MED ORDER — FENTANYL CITRATE (PF) 100 MCG/2ML IJ SOLN: 25.0000 ug | INTRAMUSCULAR | Status: DC | PRN

## 2022-12-23 MED ORDER — PROTAMINE SULFATE 10 MG/ML IV SOLN
INTRAVENOUS | Status: DC | PRN
  Administered 2022-12-23: 40 mg via INTRAVENOUS

## 2022-12-23 MED ORDER — FENTANYL CITRATE (PF) 250 MCG/5ML IJ SOLN
INTRAMUSCULAR | Status: AC
  Filled 2022-12-23: qty 5

## 2022-12-23 MED ORDER — AMISULPRIDE (ANTIEMETIC) 5 MG/2ML IV SOLN
INTRAVENOUS | Status: AC
  Filled 2022-12-23: qty 4

## 2022-12-23 MED ORDER — AMLODIPINE BESYLATE 5 MG PO TABS
5.0000 mg | ORAL_TABLET | Freq: Every morning | ORAL | Status: DC
  Administered 2022-12-24: 5 mg via ORAL
  Filled 2022-12-23: qty 1

## 2022-12-23 MED ORDER — CHLORHEXIDINE GLUCONATE 0.12 % MT SOLN
OROMUCOSAL | Status: AC
  Administered 2022-12-23: 15 mL via OROMUCOSAL
  Filled 2022-12-23: qty 15

## 2022-12-23 MED ORDER — HYDRALAZINE HCL 20 MG/ML IJ SOLN
5.0000 mg | INTRAMUSCULAR | Status: DC | PRN
  Filled 2022-12-23: qty 1

## 2022-12-23 MED ORDER — GUAIFENESIN-DM 100-10 MG/5ML PO SYRP: 15.0000 mL | ORAL_SOLUTION | ORAL | Status: DC | PRN

## 2022-12-23 MED ORDER — ACETAMINOPHEN 325 MG PO TABS
325.0000 mg | ORAL_TABLET | ORAL | Status: DC | PRN
  Administered 2022-12-23 – 2022-12-24 (×2): 650 mg via ORAL
  Filled 2022-12-23 (×2): qty 2

## 2022-12-23 MED ORDER — HEPARIN 6000 UNIT IRRIGATION SOLUTION
Status: DC | PRN
  Administered 2022-12-23: 1

## 2022-12-23 MED ORDER — PANTOPRAZOLE SODIUM 40 MG PO TBEC
40.0000 mg | DELAYED_RELEASE_TABLET | Freq: Every day | ORAL | Status: DC
  Administered 2022-12-23 – 2022-12-24 (×2): 40 mg via ORAL
  Filled 2022-12-23 (×2): qty 1

## 2022-12-23 MED ORDER — AMIODARONE IV BOLUS ONLY 150 MG/100ML
INTRAVENOUS | Status: DC | PRN
  Administered 2022-12-23: 150 mg via INTRAVENOUS

## 2022-12-23 MED ORDER — SODIUM CHLORIDE 0.9 % IV SOLN: INTRAVENOUS | Status: DC

## 2022-12-23 MED ORDER — LIDOCAINE 2% (20 MG/ML) 5 ML SYRINGE
INTRAMUSCULAR | Status: AC
  Filled 2022-12-23: qty 5

## 2022-12-23 MED ORDER — HYDRALAZINE HCL 20 MG/ML IJ SOLN
INTRAMUSCULAR | Status: AC
  Filled 2022-12-23: qty 1

## 2022-12-23 MED ORDER — METOPROLOL TARTRATE 5 MG/5ML IV SOLN: 2.0000 mg | INTRAVENOUS | Status: DC | PRN

## 2022-12-23 MED ORDER — DEXAMETHASONE SODIUM PHOSPHATE 10 MG/ML IJ SOLN
INTRAMUSCULAR | Status: DC | PRN
  Administered 2022-12-23: 5 mg via INTRAVENOUS

## 2022-12-23 MED ORDER — CEFAZOLIN SODIUM-DEXTROSE 2-4 GM/100ML-% IV SOLN
INTRAVENOUS | Status: AC
  Filled 2022-12-23: qty 100

## 2022-12-23 MED ORDER — ACETAMINOPHEN 10 MG/ML IV SOLN
1000.0000 mg | Freq: Once | INTRAVENOUS | Status: DC | PRN
  Administered 2022-12-23: 1000 mg via INTRAVENOUS

## 2022-12-23 MED ORDER — CEFAZOLIN SODIUM-DEXTROSE 2-4 GM/100ML-% IV SOLN
2.0000 g | Freq: Three times a day (TID) | INTRAVENOUS | Status: AC
  Administered 2022-12-23 (×2): 2 g via INTRAVENOUS
  Filled 2022-12-23 (×2): qty 100

## 2022-12-23 MED ORDER — MAGNESIUM SULFATE 2 GM/50ML IV SOLN: 2.0000 g | Freq: Every day | INTRAVENOUS | Status: DC | PRN

## 2022-12-23 MED ORDER — CHLORHEXIDINE GLUCONATE 0.12 % MT SOLN: 15.0000 mL | OROMUCOSAL | Status: AC

## 2022-12-23 SURGICAL SUPPLY — 38 items
ADH SKN CLS APL DERMABOND .7 (GAUZE/BANDAGES/DRESSINGS) ×2
AGENT HMST 10 BLLW SHRT CANN (HEMOSTASIS) ×2
BAG COUNTER SPONGE SURGICOUNT (BAG) ×2 IMPLANT
BAG DECANTER FOR FLEXI CONT (MISCELLANEOUS) ×2 IMPLANT
BAG SPNG CNTER NS LX DISP (BAG) ×2
CANISTER SUCT 3000ML PPV (MISCELLANEOUS) ×2 IMPLANT
CANNULA VESSEL 3MM 2 BLNT TIP (CANNULA) ×6 IMPLANT
CATH ROBINSON RED A/P 18FR (CATHETERS) ×2 IMPLANT
CLIP TI MEDIUM 24 (CLIP) ×2 IMPLANT
CLIP TI WIDE RED SMALL 24 (CLIP) ×2 IMPLANT
DERMABOND ADVANCED .7 DNX12 (GAUZE/BANDAGES/DRESSINGS) ×2 IMPLANT
ELECT REM PT RETURN 9FT ADLT (ELECTROSURGICAL) ×2
ELECTRODE REM PT RTRN 9FT ADLT (ELECTROSURGICAL) ×2 IMPLANT
GLOVE BIO SURGEON STRL SZ7.5 (GLOVE) ×2 IMPLANT
GLOVE BIOGEL PI IND STRL 8 (GLOVE) ×2 IMPLANT
GLOVE SURG POLY ORTHO LF SZ7.5 (GLOVE) IMPLANT
GLOVE SURG UNDER LTX SZ8 (GLOVE) ×2 IMPLANT
GOWN STRL REUS W/ TWL LRG LVL3 (GOWN DISPOSABLE) ×6 IMPLANT
GOWN STRL REUS W/TWL LRG LVL3 (GOWN DISPOSABLE) ×6
GRAFT VASC PATCH XENOSURE 1X14 (Vascular Products) IMPLANT
HEMOSTAT HEMOBLAST BELLOWS (HEMOSTASIS) IMPLANT
KIT BASIN OR (CUSTOM PROCEDURE TRAY) ×2 IMPLANT
KIT TURNOVER KIT B (KITS) ×2 IMPLANT
NDL HYPO 25GX1X1/2 BEV (NEEDLE) ×2 IMPLANT
NEEDLE HYPO 25GX1X1/2 BEV (NEEDLE) ×2 IMPLANT
NS IRRIG 1000ML POUR BTL (IV SOLUTION) ×4 IMPLANT
PACK CAROTID (CUSTOM PROCEDURE TRAY) ×2 IMPLANT
PAD ARMBOARD 7.5X6 YLW CONV (MISCELLANEOUS) ×4 IMPLANT
POSITIONER HEAD DONUT 9IN (MISCELLANEOUS) ×2 IMPLANT
SHUNT CAROTID FLEXCEL 14.5 (SHUNT) IMPLANT
SUT MNCRL AB 4-0 PS2 18 (SUTURE) ×2 IMPLANT
SUT PROLENE 6 0 BV (SUTURE) ×4 IMPLANT
SUT VIC AB 3-0 SH 27 (SUTURE) ×2
SUT VIC AB 3-0 SH 27X BRD (SUTURE) ×2 IMPLANT
SYR 20ML LL LF (SYRINGE) ×2 IMPLANT
SYR CONTROL 10ML LL (SYRINGE) ×2 IMPLANT
TOWEL GREEN STERILE (TOWEL DISPOSABLE) ×2 IMPLANT
WATER STERILE IRR 1000ML POUR (IV SOLUTION) ×2 IMPLANT

## 2022-12-23 NOTE — Anesthesia Postprocedure Evaluation (Signed)
Anesthesia Post Note  Patient: Angela Stein  Procedure(s) Performed: ENDARTERECTOMY CAROTID (Left) PATCH ANGIOPLASTY LEFT CAROTID ARTERY WIHT 1CM x 14CM BOVINE PATCH (Left: Neck)     Patient location during evaluation: PACU Anesthesia Type: General Level of consciousness: awake Pain management: pain level controlled Vital Signs Assessment: post-procedure vital signs reviewed and stable Respiratory status: spontaneous breathing, nonlabored ventilation and respiratory function stable Cardiovascular status: blood pressure returned to baseline and stable Postop Assessment: no apparent nausea or vomiting Anesthetic complications: no   No notable events documented.  Last Vitals:  Vitals:   12/23/22 1950 12/23/22 2000  BP:  (!) 155/75  Pulse:  88  Resp: 20 20  Temp:  36.6 C  SpO2:  97%    Last Pain:  Vitals:   12/23/22 2000  TempSrc: Oral  PainSc: 0-No pain                 Lahari Suttles P Antonyo Hinderer

## 2022-12-23 NOTE — Anesthesia Procedure Notes (Signed)
Arterial Line Insertion Start/End5/14/2024 7:05 AM, 12/23/2022 7:20 AM Performed by: Dorie Rank, CRNA, CRNA  Patient location: Pre-op. Preanesthetic checklist: patient identified, IV checked, site marked, risks and benefits discussed, surgical consent, monitors and equipment checked, pre-op evaluation, timeout performed and anesthesia consent Lidocaine 1% used for infiltration Right, radial was placed Catheter size: 20 G Hand hygiene performed  and maximum sterile barriers used   Attempts: 2 Procedure performed without using ultrasound guided technique. Following insertion, dressing applied and Biopatch. Post procedure assessment: normal and unchanged

## 2022-12-23 NOTE — Anesthesia Procedure Notes (Addendum)
Procedure Name: Intubation Date/Time: 12/23/2022 8:02 AM  Performed by: Alvera Novel, CRNAPre-anesthesia Checklist: Patient identified, Emergency Drugs available, Suction available and Patient being monitored Patient Re-evaluated:Patient Re-evaluated prior to induction Oxygen Delivery Method: Circle System Utilized Preoxygenation: Pre-oxygenation with 100% oxygen Induction Type: IV induction Ventilation: Mask ventilation without difficulty Laryngoscope Size: Mac and 3 Grade View: Grade I Tube type: Oral Tube size: 7.0 mm Number of attempts: 1 Airway Equipment and Method: Stylet and Oral airway Placement Confirmation: ETT inserted through vocal cords under direct vision, positive ETCO2 and breath sounds checked- equal and bilateral Secured at: 22 cm Tube secured with: Tape Dental Injury: Teeth and Oropharynx as per pre-operative assessment  Comments: Placed by Flonnie Hailstone, SRNA.

## 2022-12-23 NOTE — Interval H&P Note (Signed)
History and Physical Interval Note:  12/23/2022 7:20 AM  Angela Stein  has presented today for surgery, with the diagnosis of Symptomatic left carotid artery stenosis, TIA.  The various methods of treatment have been discussed with the patient and family. After consideration of risks, benefits and other options for treatment, the patient has consented to  Procedure(s): ENDARTERECTOMY CAROTID (Left) as a surgical intervention.  The patient's history has been reviewed, patient examined, no change in status, stable for surgery.  I have reviewed the patient's chart and labs.  Questions were answered to the patient's satisfaction.     Waverly Ferrari

## 2022-12-23 NOTE — Progress Notes (Addendum)
      Left neck incision healing well without hematoma No tongue deviation or facial droop  S/P left CEA for high grade left ICA stenosis and TIA symptoms  Stable post op disposition  Mosetta Pigeon PA-C  Cari Caraway, MD 4:36 PM

## 2022-12-23 NOTE — Op Note (Signed)
NAME: Angela Stein    MRN: [id number] DOB: [birth year]    DATE OF OPERATION: 12/23/2022  PREOP DIAGNOSIS:    SYMPTOMATIC CRITICAL LEFT CAROTID STENOSIS  POSTOP DIAGNOSIS:    Same  PROCEDURE:    LEFT CAROTID ENDARTERECTOMY WITH BOVINE PERICARDIAL PATCH ANGIOPLASTY  SURGEON: Di Kindle. Edilia Bo, MD  ASSIST: Kayren Eaves, PA  ANESTHESIA: General  EBL: Minimal  INDICATIONS:    Angela Stein is a 72 y.o. female who had a follow-up study had significant progression of the left carotid stenosi to greater than 80%.  The systolic velocity was 800 cm/s with an end-diastolic velocity of 250 cm/s.  There was no significant stenosis on the right side.  She had multiple left hemispheric symptoms.  Left carotid endarterectomy was recommended in order to lower her risk of future stroke.  FINDINGS:   The plaque extended into the common carotid artery for a fairly long length.  At the completion the patient awoke neurologically intact.  TECHNIQUE:   Patient was brought to the operating room after arterial line was placed by anesthesia.  The patient received a general anesthetic.  The left neck was prepped and draped in the usual sterile fashion after careful positioning.  An incision was made along the anterior border of the sternocleidomastoid and dissection carried down to the common carotid artery which was dissected free control with Rummel tourniquet.  The patient was heparinized.  ACT was monitored throughout the procedure.  I then divided the facial vein between silk ties.  The internal carotid artery was controlled above the plaque and the external carotid artery, superior thyroid artery,  and a large pharyngeal branch were all controlled.  The internal carotid artery was then clamped in the common and external carotid arteries.  A longitudinal arteriotomy was made in the common carotid artery.  This was extended through the plaque into the internal carotid artery above the plaque.  The  arteriotomy was extended into the common carotid artery past the bulk of the plaque here.  A 10 shunt was placed into the internal carotid artery backbled and then placed into the common carotid artery and secured with Rummel tourniquet.  Flow was reestablished to the shunt and checked with a Doppler.  An endarterectomy plane was established proximally where the plaque thinned out.  This was divided.  Eversion endarterectomy was performed of the external carotid artery. Distally there was a nice taper.  2 tacking sutures were placed.  The artery was irrigated with copious amounts of heparin and dextran and all loose debris removed.  Bovine pericardial patch was then sewn using continuous 6-0 Prolene suture.  Prior to completing the patch closure the shunt was removed.  The artery was backbled and flushed appropriately and the anastomosis completed.  Flow was reestablished first to the external carotid artery and into the internal carotid artery.  Hemostasis was obtained in the wound.  The wound was then closed with a deep layer 3-0 Vicryl.  The platysma was closed with running 3-0 Vicryl.  The skin was closed with a 4-0 Monocryl.  Dermabond was applied.  The patient tolerated the procedure well and was transferred to the recovery room in stable condition.  All needle and sponge counts were correct.  Given the complexity of the case,  the assistant was necessary in order to expedient the procedure and safely perform the technical aspects of the operation.  The assistant provided traction and countertraction to assist with exposure of the common carotid artery,  external carotid artery, and internal carotid artery.  They also assisted with suture ligatures and dividing the facial vein and multiple small venous branches tethering the hypoglossal nerve. The assistant also played a critical role in placing the shunt safely.  In addition they were necessary to provide adequate traction and countertraction to perform a  precise endarterectomy and precise closure.  These skills, especially following the Prolene suture for the anastomosis, could not have been adequately performed by a scrub tech assistant.    Waverly Ferrari, MD, FACS [facility]  DATE OF DICTATION:   12/23/2022

## 2022-12-23 NOTE — Transfer of Care (Signed)
Immediate Anesthesia Transfer of Care Note  Patient: Angela Stein  Procedure(s) Performed: ENDARTERECTOMY CAROTID (Left) PATCH ANGIOPLASTY LEFT CAROTID ARTERY WIHT 1CM x 14CM BOVINE PATCH (Left: Neck)  Patient Location: PACU  Anesthesia Type:General  Level of Consciousness: awake, alert , and oriented  Airway & Oxygen Therapy: Patient Spontanous Breathing and Patient connected to face mask oxygen  Post-op Assessment: Report given to RN, Post -op Vital signs reviewed and stable, Patient moving all extremities X 4, and Patient able to stick tongue midline  Post vital signs: Reviewed and stable  Last Vitals:  Vitals Value Taken Time  BP 130/63 12/23/22 1039  Temp    Pulse 61 12/23/22 1045  Resp 17 12/23/22 1045  SpO2 92 % 12/23/22 1045  Vitals shown include unvalidated device data.  Last Pain:  Vitals:   12/23/22 0601  TempSrc: Oral  PainSc: 0-No pain      Patients Stated Pain Goal: 0 (12/23/22 0601)  Complications: No notable events documented.

## 2022-12-24 ENCOUNTER — Encounter (HOSPITAL_COMMUNITY): Payer: Self-pay | Admitting: Vascular Surgery

## 2022-12-24 LAB — CBC
HCT: 31 % — ABNORMAL LOW (ref 36.0–46.0)
Hemoglobin: 10.4 g/dL — ABNORMAL LOW (ref 12.0–15.0)
MCH: 28.6 pg (ref 26.0–34.0)
MCHC: 33.5 g/dL (ref 30.0–36.0)
MCV: 85.2 fL (ref 80.0–100.0)
Platelets: 222 10*3/uL (ref 150–400)
RBC: 3.64 MIL/uL — ABNORMAL LOW (ref 3.87–5.11)
RDW: 15.5 % (ref 11.5–15.5)
WBC: 11.5 10*3/uL — ABNORMAL HIGH (ref 4.0–10.5)
nRBC: 0 % (ref 0.0–0.2)

## 2022-12-24 LAB — BASIC METABOLIC PANEL
Anion gap: 9 (ref 5–15)
BUN: 14 mg/dL (ref 8–23)
CO2: 24 mmol/L (ref 22–32)
Calcium: 8.7 mg/dL — ABNORMAL LOW (ref 8.9–10.3)
Chloride: 102 mmol/L (ref 98–111)
Creatinine, Ser: 0.66 mg/dL (ref 0.44–1.00)
GFR, Estimated: >60 mL/min (ref >60)
Glucose, Bld: 134 mg/dL — ABNORMAL HIGH (ref 70–99)
Potassium: 4.2 mmol/L (ref 3.5–5.1)
Sodium: 135 mmol/L (ref 135–145)

## 2022-12-24 LAB — MAGNESIUM: Magnesium: 2 mg/dL (ref 1.7–2.4)

## 2022-12-24 LAB — POCT ACTIVATED CLOTTING TIME
Activated Clotting Time: 228 seconds
Activated Clotting Time: 282 seconds

## 2022-12-24 LAB — LIPID PANEL
Cholesterol: 120 mg/dL (ref 0–200)
HDL: 57 mg/dL (ref >40)
LDL Cholesterol: 53 mg/dL (ref 0–99)
Total CHOL/HDL Ratio: 2.1 RATIO
Triglycerides: 51 mg/dL (ref <150)
VLDL: 10 mg/dL (ref 0–40)

## 2022-12-24 MED ORDER — ROSUVASTATIN CALCIUM 5 MG PO TABS
10.0000 mg | ORAL_TABLET | Freq: Every day | ORAL | Status: DC
  Administered 2022-12-24: 10 mg via ORAL
  Filled 2022-12-24: qty 2

## 2022-12-24 MED ORDER — ROSUVASTATIN CALCIUM 10 MG PO TABS: 10.0000 mg | ORAL_TABLET | Freq: Every day | ORAL | 3 refills | Status: AC

## 2022-12-24 MED ORDER — OXYCODONE-ACETAMINOPHEN 5-325 MG PO TABS: 1.0000 | ORAL_TABLET | Freq: Four times a day (QID) | ORAL | 0 refills | Status: DC | PRN

## 2022-12-24 NOTE — Progress Notes (Signed)
  VASCULAR SURGERY ASSESSMENT & PLAN:   POD 1 S/P LEFT CAROTID ENDARTERECTOMY: Patient is doing well.  Neuro is intact.  Her incision looks fine.  She can restart her Eliquis today.  VASCULAR QUALITY INITIATIVE: She is on aspirin and a statin.  Ready for discharge today.  SUBJECTIVE:   No specific complaints.  PHYSICAL EXAM:   Vitals:   12/24/22 0000 12/24/22 0004 12/24/22 0021 12/24/22 0403  BP:  (!) 164/87 (!) 152/67 (!) 151/61  Pulse:  79 78 76  Resp: 20 20 16 17   Temp:  98 F (36.7 C)  98.1 F (36.7 C)  TempSrc:  Oral  Oral  SpO2:  96% 94% 94%  Weight:      Height:       Her neck incision looks fine. No focal weakness or paresthesias.  LABS:   Lab Results  Component Value Date   WBC 11.5 (H) 12/24/2022   HGB 10.4 (L) 12/24/2022   HCT 31.0 (L) 12/24/2022   MCV 85.2 12/24/2022   PLT 222 12/24/2022   Lab Results  Component Value Date   CREATININE 0.66 12/24/2022   Lab Results  Component Value Date   INR 1.0 12/23/2022   PROBLEM LIST:    Principal Problem:   Status post carotid endarterectomy Active Problems:   Symptomatic carotid artery stenosis, left   CURRENT MEDS:    amLODipine  5 mg Oral q AM   aspirin EC  81 mg Oral QPM   docusate sodium  100 mg Oral Daily   dofetilide  500 mcg Oral Q12H   ezetimibe  10 mg Oral QPM   losartan  50 mg Oral BID   metoprolol tartrate  75 mg Oral BID   pantoprazole  40 mg Oral Daily   pravastatin  20 mg Oral Daily    Waverly Ferrari Office: [phone] 12/24/2022

## 2022-12-24 NOTE — Progress Notes (Signed)
PHARMACIST LIPID MONITORING   Angela Stein is a 72 y.o. female admitted on 12/23/2022 with critical L carotid stenosis s/p endarterectomy.  Pharmacy has been consulted to optimize lipid-lowering therapy with the indication of secondary prevention for clinical ASCVD.  Recent Labs:  Lipid Panel (last 6 months):   Lab Results  Component Value Date   CHOL 120 12/24/2022   TRIG 51 12/24/2022   HDL 57 12/24/2022   CHOLHDL 2.1 12/24/2022   VLDL 10 12/24/2022   LDLCALC 53 12/24/2022    Hepatic function panel (last 6 months):   Lab Results  Component Value Date   AST 17 12/23/2022   ALT 15 12/23/2022   ALKPHOS 75 12/23/2022   BILITOT 0.4 12/23/2022    SCr (since admission):   Serum creatinine: 0.66 mg/dL 16/10/96 0454 Estimated creatinine clearance: 52.7 mL/min  Current therapy and lipid therapy tolerance Current lipid-lowering therapy: pravastatin 20mg  Previous lipid-lowering therapies (if applicable): none but has strong family history of intolerances so is hesitant  Documented or reported allergies or intolerances to lipid-lowering therapies (if applicable): none  Assessment:   Patient agrees with changes to lipid-lowering therapy. Patient has not had issues with pravastatin and is willing to increase to rosvuastatin 10mg . Patient is aware and will follow up for increase to 20mg  outpatient if has no issues on rosuvastatin 10mg .   Plan:    1.Statin intensity (high intensity recommended for all patients regardless of the LDL):  Add or increase statin to high intensity.  2.Add ezetimibe (if any one of the following):   Not indicated at this time.  3.Refer to lipid clinic:   No  4.Follow-up with:  Primary care provider - Richardean Chimera, MD  5.Follow-up labs after discharge:  Changes in lipid therapy were made. Check a lipid panel in 8-12 weeks then annually.     Alphia Moh, PharmD, BCPS, BCCP Clinical Pharmacist  Please check AMION for all [facility] phone numbers After  10:00 PM, call Main Pharmacy [phone]

## 2022-12-24 NOTE — Discharge Instructions (Signed)
   [facility]   Discharge Instructions   Carotid Endarterectomy (CEA)  Please refer to the following instructions for your post-procedure care. Your surgeon or physician assistant will discuss any changes with you.  Activity  You are encouraged to walk as much as you can. You can slowly return to normal activities but must avoid strenuous activity and heavy lifting until your doctor tell you it's OK. Avoid activities such as vacuuming or swinging a golf club. You can drive after one week if you are comfortable and you are no longer taking prescription pain medications. It is normal to feel tired for serval weeks after your surgery. It is also normal to have difficulty with sleep habits, eating, and bowel movements after surgery. These will go away with time.  Bathing/Showering  You may shower after you come home. Do not soak in a bathtub, hot tub, or swim until the incision heals completely.  Incision Care  Shower every day. Clean your incision with mild soap and water. Pat the area dry with a clean towel. You do not need a bandage unless otherwise instructed. Do not apply any ointments or creams to your incision. You may have skin glue on your incision. Do not peel it off. It will come off on its own in about one week. Your incision may feel thickened and raised for several weeks after your surgery. This is normal and the skin will soften over time. For Men Only: It's OK to shave around the incision but do not shave the incision itself for 2 weeks. It is common to have numbness under your chin that could last for several months.  Diet  Resume your normal diet. There are no special food restrictions following this procedure. A low fat/low cholesterol diet is recommended for all patients with vascular disease. In order to heal from your surgery, it is CRITICAL to get adequate nutrition. Your body requires vitamins, minerals, and protein. Vegetables are the best  source of vitamins and minerals. Vegetables also provide the perfect balance of protein. Processed food has little nutritional value, so try to avoid this.        Medications  Resume taking all of your medications unless your doctor or physician assistant tells you not to. If your incision is causing pain, you may take over-the- counter pain relievers such as acetaminophen (Tylenol). If you were prescribed a stronger pain medication, please be aware these medications can cause nausea and constipation. Prevent nausea by taking the medication with a snack or meal. Avoid constipation by drinking plenty of fluids and eating foods with a high amount of fiber, such as fruits, vegetables, and grains. Do not take Tylenol if you are taking prescription pain medications.  Follow Up  Our office will schedule a follow up appointment 2-3 weeks following discharge.  Please call us immediately for any of the following conditions  Increased pain, redness, drainage (pus) from your incision site. Fever of 101 degrees or higher. If you should develop stroke (slurred speech, difficulty swallowing, weakness on one side of your body, loss of vision) you should call 911 and go to the nearest emergency room.  Reduce your risk of vascular disease:  Stop smoking. If you would like help call [facility] at 1-800-QUIT-NOW (1-800-784-8669) or [place] at [phone]. Manage your cholesterol Maintain a desired weight Control your diabetes Keep your blood pressure down  If you have any questions, please call the office at [phone].   

## 2022-12-24 NOTE — Plan of Care (Signed)
  Problem: Education: Goal: Knowledge of General Education information will improve Description Including pain rating scale, medication(s)/side effects and non-pharmacologic comfort measures Outcome: Adequate for Discharge   Problem: Health Behavior/Discharge Planning: Goal: Ability to manage health-related needs will improve Outcome: Adequate for Discharge   

## 2022-12-25 NOTE — Discharge Summary (Signed)
Discharge Summary     Angela Stein 20-Dec-1950 72 y.o. female  [id number]  Admission Date: 12/23/2022  Discharge Date: 12/24/22  Physician: Dr. Edilia Bo  Admission Diagnosis: Status post carotid endarterectomy [Z98.890] Symptomatic carotid artery stenosis, left [I65.22]  Discharge Day services:    See progress note 12/24/22  Hospital Course:  Angela Stein is a 72 year old female who was brought in as an outpatient and underwent left carotid endarterectomy by Dr. Edilia Bo on 12/23/2022 due to symptomatic high-grade left ICA stenosis.  She tolerated the procedure well and was admitted to the hospital postoperatively.  POD #1 she did not experience any neurological events.  She was ready for discharge home.  She will follow-up in office in about 2 to 3 weeks.  She will continue her aspirin and statin daily.  Eliquis can be restarted on the evening of 12/24/2022.  She will also be prescribed 1 to 2 days of narcotic pain medication for continued postoperative pain control.  Discharge instructions were reviewed with the patient and she voiced her understanding.  She was discharged home in stable condition.   Recent Labs    12/23/22 0613 12/24/22 0209  NA 136 135  K 3.5 4.2  CL 101 102  CO2 24 24  GLUCOSE 113* 134*  BUN 13 14  CALCIUM 8.6* 8.7*   Recent Labs    12/23/22 0613 12/24/22 0209  WBC 8.1 11.5*  HGB 11.0* 10.4*  HCT 33.9* 31.0*  PLT 270 222   Recent Labs    12/23/22 0613  INR 1.0       Discharge Diagnosis:  Status post carotid endarterectomy [Z98.890] Symptomatic carotid artery stenosis, left [I65.22]  Secondary Diagnosis: Patient Active Problem List   Diagnosis Date Noted   Status post carotid endarterectomy 12/23/2022   Symptomatic carotid artery stenosis, left 12/23/2022   Atrial fibrillation (HCC) 05/10/2019   LUQ pain 05/10/2019   LLQ pain 05/10/2019   Past Medical History:  Diagnosis Date   Atrial fibrillation (HCC)    Carotid artery  occlusion    Coronary artery disease    Dysrhythmia    Hypertension     Allergies as of 12/24/2022       Reactions   Ciprofloxacin Hives   Lisinopril Swelling        Medication List     STOP taking these medications    pravastatin 20 MG tablet Commonly known as: Pravachol       TAKE these medications    amLODipine 5 MG tablet Commonly known as: NORVASC Take 5 mg by mouth in the morning.   aspirin EC 81 MG tablet Take 81 mg by mouth every evening. Swallow whole.   CALCIUM 600 PO Take 600 mg by mouth 2 (two) times daily.   dofetilide 500 MCG capsule Commonly known as: TIKOSYN Take 500 mcg by mouth every 12 (twelve) hours.   Eliquis 5 MG Tabs tablet Generic drug: apixaban Take 1 tablet (5 mg total) by mouth 2 (two) times daily.   ezetimibe 10 MG tablet Commonly known as: ZETIA Take 10 mg by mouth every evening.   furosemide 40 MG tablet Commonly known as: LASIX Take 20-40 mg by mouth daily as needed for fluid.   losartan 50 MG tablet Commonly known as: COZAAR Take 50 mg by mouth 2 (two) times daily.   metoprolol tartrate 50 MG tablet Commonly known as: LOPRESSOR Take 75 mg by mouth 2 (two) times daily.   oxyCODONE-acetaminophen 5-325 MG tablet Commonly known as: PERCOCET/ROXICET Take  1 tablet by mouth every 6 (six) hours as needed for moderate pain.   rosuvastatin 10 MG tablet Commonly known as: CRESTOR Take 1 tablet (10 mg total) by mouth daily.   saccharomyces boulardii 250 MG capsule Commonly known as: FLORASTOR Take 250 mg by mouth daily.   valACYclovir 1000 MG tablet Commonly known as: VALTREX Take 1,000 mg by mouth daily as needed (shingles/mouth ulcers).   Vitamin D3 25 MCG (1000 UT) Caps Take 1,000 Units by mouth 2 (two) times daily.         Discharge Instructions:   [facility] Discharge Instructions Carotid Endarterectomy (CEA)  Please refer to the following instructions for your  post-procedure care. Your surgeon or physician assistant will discuss any changes with you.  Activity  You are encouraged to walk as much as you can. You can slowly return to normal activities but must avoid strenuous activity and heavy lifting until your doctor tell you it's OK. Avoid activities such as vacuuming or swinging a golf club. You can drive after one week if you are comfortable and you are no longer taking prescription pain medications. It is normal to feel tired for serval weeks after your surgery. It is also normal to have difficulty with sleep habits, eating, and bowel movements after surgery. These will go away with time.  Bathing/Showering  You may shower after you come home. Do not soak in a bathtub, hot tub, or swim until the incision heals completely.  Incision Care  Shower every day. Clean your incision with mild soap and water. Pat the area dry with a clean towel. You do not need a bandage unless otherwise instructed. Do not apply any ointments or creams to your incision. You may have skin glue on your incision. Do not peel it off. It will come off on its own in about one week. Your incision may feel thickened and raised for several weeks after your surgery. This is normal and the skin will soften over time. For Men Only: It's OK to shave around the incision but do not shave the incision itself for 2 weeks. It is common to have numbness under your chin that could last for several months.  Diet  Resume your normal diet. There are no special food restrictions following this procedure. A low fat/low cholesterol diet is recommended for all patients with vascular disease. In order to heal from your surgery, it is CRITICAL to get adequate nutrition. Your body requires vitamins, minerals, and protein. Vegetables are the best source of vitamins and minerals. Vegetables also provide the perfect balance of protein. Processed food has little nutritional value, so try to avoid  this.  Medications  Resume taking all of your medications unless your doctor or physician assistant tells you not to.  If your incision is causing pain, you may take over-the- counter pain relievers such as acetaminophen (Tylenol). If you were prescribed a stronger pain medication, please be aware these medications can cause nausea and constipation.  Prevent nausea by taking the medication with a snack or meal. Avoid constipation by drinking plenty of fluids and eating foods with a high amount of fiber, such as fruits, vegetables, and grains. Do not take Tylenol if you are taking prescription pain medications.  Follow Up  Our office will schedule a follow up appointment 2-3 weeks following discharge.  Please call us immediately for any of the following conditions  Increased pain, redness, drainage (pus) from your incision site. Fever of 101 degrees  or higher. If you should develop stroke (slurred speech, difficulty swallowing, weakness on one side of your body, loss of vision) you should call 911 and go to the nearest emergency room.  Reduce your risk of vascular disease:  Stop smoking. If you would like help call [facility] at 1-800-QUIT-NOW (340-128-4724) or [facility] at (754)509-1162. Manage your cholesterol Maintain a desired weight Control your diabetes Keep your blood pressure down  If you have any questions, please call the office at [phone].   Disposition: home  Patient's condition: is Good  Follow up: 1. [facility] in 2 weeks.   Emilie Rutter, PA-C [facility] [phone]   --- For [facility] Registry use ---   Modified Rankin score at D/C (0-6): 0  IV medication needed for:  1. Hypertension: No 2. Hypotension: No  Post-op Complications: No  1. Post-op CVA or TIA: No  If yes: Event classification (right eye, left eye, right cortical, left cortical, verterobasilar, other):   If yes: Timing of event (intra-op, <6 hrs post-op, >=6 hrs  post-op, unknown):   2. CN injury: No  If yes: CN  injuried   3. Myocardial infarction: No  If yes: Dx by (EKG or clinical, Troponin):   4.  CHF: No  5.  Dysrhythmia (new): No  6. Wound infection: No  7. Reperfusion symptoms: No  8. Return to OR: No  If yes: return to OR for (bleeding, neurologic, other CEA incision, other):   Discharge medications: Statin use:  Yes ASA use:  Yes   Beta blocker use:  Yes ACE-Inhibitor use:  No  ARB use:  Yes CCB use: Yes P2Y12 Antagonist use: No, [ ]  Plavix, [ ]  Plasugrel, [ ]  Ticlopinine, [ ]  Ticagrelor, [ ]  Other, [ ]  No for medical reason, [ ]  Non-compliant, [ ]  Not-indicated Anti-coagulant use:  Yes, [ ]  Warfarin, [ ]  Rivaroxaban, [ ]  Dabigatran,

## 2023-01-07 ENCOUNTER — Ambulatory Visit: Payer: Medicare PPO | Admitting: Neurology

## 2023-01-07 ENCOUNTER — Encounter: Payer: Self-pay | Admitting: Neurology

## 2023-01-07 VITALS — BP 163/61 | HR 73 | Ht 61.0 in | Wt 145.4 lb

## 2023-01-07 DIAGNOSIS — G451 Carotid artery syndrome (hemispheric): Secondary | ICD-10-CM

## 2023-01-07 NOTE — Patient Instructions (Signed)
Great to meet you. Continue all your medications. For any sudden change in symptoms, go to ER immediately. Follow-up as needed, call for any changes.

## 2023-01-07 NOTE — Progress Notes (Signed)
NEUROLOGY CONSULTATION NOTE  Angela Stein MRN: [id number] DOB: 10-28-1950  Referring provider: Dr. Donzetta Sprung Primary care provider: Dr. Donzetta Sprung  Reason for consult:  right facial numbness, aphasia  Dear Dr Reuel Boom:  Thank you for your kind referral of Angela Stein for consultation of the above symptoms. Although her history is well known to you, please allow me to reiterate it for the purpose of our medical record. She is alone in the office today. Records and images were personally reviewed where available.   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 In April 2024, she reported repetitive transient episodes of right hand numbness/weakness to her PCP, she could not pick anything up for a few seconds. 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 She became concerned and saw her PCP on 12/04/22 when she had a transient episode of right facial numbness lasting 30 seconds, then later on an episode of right facial numbness and slurred speech, word-finding difficulty for another 30 seconds. She has chronic leg pain from PAD, no change in leg symptoms. No headaches. She had one episode where she felt dizzy and had to hold on to the door. No loss of consciousness. She denies any staring/unresponsive episodes. She reports vision improved after eye surgery but she still had episodes of a "foggy look" on the left eye that would clear up after a few seconds. She saw vascular surgeon Dr. 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 During the visit, she reported these recurrent  right-sided symptoms and had a carotid ultrasound done showing critical left ICA stenosis (80-99%), right ICA 1-39%. She underwent left carotid endarterectomy on 12/23/22 and denies any further episodes of right hand weakness, right facial numbness, slurred speech, left eye fogginess, or aphasia. Her legs continues to bother her, R>L. Last fall was after Christmas, she tripped on a rug. She lives with her husband.     PAST MEDICAL HISTORY: Past Medical History:  Diagnosis Date   Atrial fibrillation (HCC)    Carotid artery occlusion    Coronary artery disease    Dysrhythmia    Hypertension     PAST SURGICAL HISTORY: Past Surgical History:  Procedure Laterality Date   ABDOMINAL AORTOGRAM W/LOWER EXTREMITY Bilateral 04/11/2022   Procedure: ABDOMINAL AORTOGRAM W/LOWER EXTREMITY;  Surgeon: Chuck Hint, MD;  Location: [facility] INVASIVE CV LAB;  Service: Cardiovascular;  Laterality: Bilateral;   ABDOMINAL HYSTERECTOMY     AORTIC ARCH ANGIOGRAPHY Bilateral 04/11/2022   Procedure: AORTIC ARCH ANGIOGRAPHY;  Surgeon: Chuck Hint, MD;  Location[facility] INVASIVE CV LAB;  Service: Cardiovascular;  Laterality: Bilateral;   COLONOSCOPY N/A 06/15/2019   Procedure: COLONOSCOPY;  Surgeon: Malissa Hippo, MD;  Location: [facility] ENDO SUITE;  Service: Endoscopy;  Laterality: N/A;   DILATION AND CURETTAGE OF UTERUS     ENDARTERECTOMY Left 12/23/2022   Procedure: ENDARTERECTOMY CAROTID;  Surgeon: Chuck Hint, MD;  Location: [facility] OR;  Service: Vascular;  Laterality: Left;   PATCH ANGIOPLASTY Left 12/23/2022   Procedure: PATCH ANGIOPLASTY LEFT CAROTID ARTERY WIHT 1CM x 14CM BOVINE PATCH;  Surgeon: Chuck Hint, MD;  Location: [facility] OR;  Service:  Vascular;  Laterality: Left;   POLYPECTOMY  06/15/2019   Procedure: POLYPECTOMY;  Surgeon: Malissa Hippo, MD;  Location: [facility];  Service: Endoscopy;;  cold snare rectosigmoid polyp   TONSILLECTOMY      MEDICATIONS: Current Outpatient  Medications on File Prior to Visit  Medication Sig Dispense Refill   amLODipine (NORVASC) 5 MG tablet Take 5 mg by mouth in the morning.     apixaban (ELIQUIS) 5 MG TABS tablet Take 1 tablet (5 mg total) by mouth 2 (two) times daily. 60 tablet    aspirin EC 81 MG tablet Take 81 mg by mouth every evening. Swallow whole.     Calcium Carbonate (CALCIUM 600 PO) Take 600 mg by mouth 2 (two) times daily.     Cholecalciferol (VITAMIN D3) 25 MCG (1000 UT) CAPS Take 1,000 Units by mouth 2 (two) times daily.      dofetilide (TIKOSYN) 500 MCG capsule Take 500 mcg by mouth every 12 (twelve) hours.     ezetimibe (ZETIA) 10 MG tablet Take 10 mg by mouth every evening.     furosemide (LASIX) 40 MG tablet Take 20-40 mg by mouth daily as needed for fluid.     losartan (COZAAR) 50 MG tablet Take 50 mg by mouth 2 (two) times daily.     metoprolol tartrate (LOPRESSOR) 50 MG tablet Take 75 mg by mouth 2 (two) times daily.      oxyCODONE-acetaminophen (PERCOCET/ROXICET) 5-325 MG tablet Take 1 tablet by mouth every 6 (six) hours as needed for moderate pain. 15 tablet 0   rosuvastatin (CRESTOR) 10 MG tablet Take 1 tablet (10 mg total) by mouth daily. 90 tablet 3   saccharomyces boulardii (FLORASTOR) 250 MG capsule Take 250 mg by mouth daily.     valACYclovir (VALTREX) 1000 MG tablet Take 1,000 mg by mouth daily as needed (shingles/mouth ulcers).     No current facility-administered medications on file prior to visit.    ALLERGIES: Allergies  Allergen Reactions   Ciprofloxacin Hives   Lisinopril Swelling    FAMILY HISTORY: History reviewed. No pertinent family history.  SOCIAL HISTORY: Social History   Socioeconomic History   Marital status: Married    Spouse name: Not on file   Number of children: Not on file   Years of education: Not on file   Highest education level: Not on file  Occupational History   Not on file  Tobacco Use   Smoking status: Former    Types: Cigarettes    Quit date: 04/2022     Years since quitting: 0.7   Smokeless tobacco: Never  Vaping Use   Vaping Use: Never used  Substance and Sexual Activity   Alcohol use: Not Currently    Comment: Once a year   Drug use: Never   Sexual activity: Not on file  Other Topics Concern   Not on file  Social History Narrative   Are you right handed or left handed? Right handed    Are you currently employed ?  Retired    What is your current occupation?   Do you live at home alone? No    Who lives with you? Family    What type of home do you live in: 1 story or 2 story?  2 story with steps        Social Determinants of Health   Financial Resource Strain: Not on file  Food Insecurity: Not on file  Transportation Needs: Not on file  Physical Activity: Not  on file  Stress: Not on file  Social Connections: Not on file  Intimate Partner Violence: Not on file     PHYSICAL EXAM: Vitals:   01/07/23 1351  BP: (!) 163/61  Pulse: 73  SpO2: 90%   General: No acute distress Head:  Normocephalic/atraumatic Skin/Extremities: No rash, no edema Neurological Exam: Mental status: alert and awake, no dysarthria or aphasia, Fund of knowledge is appropriate.  Attention and concentration are normal. Able to read, name objects and repeat phrases. Cranial nerves: CN I: not tested CN II: pupils equal, round, visual fields intact CN III, IV, VI:  full range of motion, no nystagmus, no ptosis CN V: facial sensation intact CN VII: upper and lower face symmetric CN VIII: hearing intact to conversation Bulk & Tone: normal, no fasciculations. Motor: 5/5 throughout with no pronator drift. Sensation: intact to light touch, cold, pin on both UE, decreased cold and pin on right LE, decreased vibration sense to knees bilaterally.  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 On follow-up with Vascular Surgery earlier this month, she was found to have critical stenosis of the left ICA causing the recurrent TIAs. She underwent left CEA on 12/23/22 and has not had any similar symptoms since then. Her neurological exam shows slight decreased sensation on the left leg (chronic), otherwise normal. ABCD2 score 2 for age/BP. She is on Eliquis, aspirin, continue control of vascular risk factors. She knows to go to the ER for any sudden change in symptoms. Follow-up as needed, call for any changes.    Thank you for allowing me to participate in the care of this patient. Please do not hesitate to call for any questions or concerns.   Patrcia Dolly, M.D.  CC: Dr. Reuel Boom

## 2023-01-12 DIAGNOSIS — G459 Transient cerebral ischemic attack, unspecified: Secondary | ICD-10-CM | POA: Diagnosis not present

## 2023-01-12 DIAGNOSIS — I10 Essential (primary) hypertension: Secondary | ICD-10-CM | POA: Diagnosis not present

## 2023-01-12 DIAGNOSIS — E7849 Other hyperlipidemia: Secondary | ICD-10-CM | POA: Diagnosis not present

## 2023-01-15 ENCOUNTER — Encounter: Payer: Self-pay | Admitting: Physician Assistant

## 2023-01-15 ENCOUNTER — Ambulatory Visit (INDEPENDENT_AMBULATORY_CARE_PROVIDER_SITE_OTHER): Payer: Medicare PPO | Admitting: Physician Assistant

## 2023-01-15 VITALS — BP 143/76 | HR 70 | Temp 97.8°F | Resp 16 | Ht 61.0 in | Wt 145.0 lb

## 2023-01-15 DIAGNOSIS — I6522 Occlusion and stenosis of left carotid artery: Secondary | ICD-10-CM

## 2023-01-15 NOTE — Progress Notes (Signed)
POST OPERATIVE OFFICE NOTE    CC:  F/u for surgery  HPI:  This is a 72 y.o. female who is s/p left CEA for symptomatic carotid artery stenosis on 12/23/2022 by Dr. Edilia Bo.  She had multiple episodes of right arm weakness and transient numbness on the right side of her face.    Carotid duplex on 12/18/2022 revealed right ICA stenosis of 1-39%  She was originally seen by Dr. Arbie Cookey in August 2023 for BLE arterial insufficiency. On 04/11/2022 she underwent an arteriogram. She was found to have severe aortoiliac occlusive disease and her infrarenal aorta was heavily calcified. At that time she had claudication but no critical limb ischemia. She also had degenerative disc disease of her back and I felt that some of her leg symptoms could be attributed to this. If her symptoms progressed she would need a CT angiogram to further evaluate the extent of her calcific disease. She quit smoking in September 2023.  He discussed with her that he would only consider ABF if her symptoms progressed significantly.    Pt returns today for follow up.  Pt states she is doing well.  She still has some blurry vision in the left eye.  This was present prior to surgery and is unchanged.  She denies any new unilateral weakness, numbness, paralysis or speech difficulties.    She states that her dog caused a sore on the right lateral lower leg yesterday.    She is compliant with her asa/statin.  She is on eliquis.    Allergies  Allergen Reactions   Ciprofloxacin Hives   Lisinopril Swelling    Current Outpatient Medications  Medication Sig Dispense Refill   amLODipine (NORVASC) 5 MG tablet Take 5 mg by mouth in the morning.     apixaban (ELIQUIS) 5 MG TABS tablet Take 1 tablet (5 mg total) by mouth 2 (two) times daily. 60 tablet    aspirin EC 81 MG tablet Take 81 mg by mouth every evening. Swallow whole.     Calcium Carbonate (CALCIUM 600 PO) Take 600 mg by mouth 2 (two) times daily.     Cholecalciferol (VITAMIN D3)  25 MCG (1000 UT) CAPS Take 1,000 Units by mouth 2 (two) times daily.      dofetilide (TIKOSYN) 500 MCG capsule Take 500 mcg by mouth every 12 (twelve) hours.     ezetimibe (ZETIA) 10 MG tablet Take 10 mg by mouth every evening.     furosemide (LASIX) 40 MG tablet Take 20-40 mg by mouth daily as needed for fluid.     losartan (COZAAR) 50 MG tablet Take 50 mg by mouth 2 (two) times daily.     metoprolol tartrate (LOPRESSOR) 50 MG tablet Take 75 mg by mouth 2 (two) times daily.      oxyCODONE-acetaminophen (PERCOCET/ROXICET) 5-325 MG tablet Take 1 tablet by mouth every 6 (six) hours as needed for moderate pain. 15 tablet 0   rosuvastatin (CRESTOR) 10 MG tablet Take 1 tablet (10 mg total) by mouth daily. 90 tablet 3   saccharomyces boulardii (FLORASTOR) 250 MG capsule Take 250 mg by mouth daily.     valACYclovir (VALTREX) 1000 MG tablet Take 1,000 mg by mouth daily as needed (shingles/mouth ulcers).     No current facility-administered medications for this visit.     ROS:  See HPI  Physical Exam:  Today's Vitals   01/15/23 1522 01/15/23 1526  BP: 136/71 (!) 143/76  Pulse: 68 70  Resp: 16   Temp: 97.8  F (36.6 C)   TempSrc: Temporal   SpO2: 96%   Weight: 145 lb (65.8 kg)   Height: 5\' 1"  (1.549 m)    Body mass index is 27.4 kg/m.   Incision:  healing nicely.  Extremities:  distal pulses are not palpable. Bilateral feet are warm. Small sore on lateral right leg.      Assessment/Plan:  This is a 72 y.o. female who is s/p: left CEA for symptomatic carotid artery stenosis on 12/23/2022 by Dr. Edilia Bo.  -pt doing well since surgery.  Her incision is healing.  Ok for swimming after 6/15 as long as incision has healed.  -as far as her aorto-iliac occlusive disease, this is stable.  She does have a sore on the right lateral leg caused by her dog yesterday as pictured above.  Discussed with her if this does not improve or worsens, we would need to see her back sooner.  She will continue  to protect her feet.  Fortunately, she quit smoking last year.  -Will have her return in 9 months with carotid duplex and ABI -continue asa/statin   Doreatha Massed, [facility] [phone]   Clinic MD:  Edilia Bo

## 2023-01-16 DIAGNOSIS — R2681 Unsteadiness on feet: Secondary | ICD-10-CM | POA: Diagnosis not present

## 2023-01-16 DIAGNOSIS — M6281 Muscle weakness (generalized): Secondary | ICD-10-CM | POA: Diagnosis not present

## 2023-01-19 ENCOUNTER — Other Ambulatory Visit: Payer: Self-pay | Admitting: *Deleted

## 2023-01-19 DIAGNOSIS — D6869 Other thrombophilia: Secondary | ICD-10-CM | POA: Diagnosis not present

## 2023-01-19 DIAGNOSIS — E7849 Other hyperlipidemia: Secondary | ICD-10-CM | POA: Diagnosis not present

## 2023-01-19 DIAGNOSIS — I4891 Unspecified atrial fibrillation: Secondary | ICD-10-CM | POA: Diagnosis not present

## 2023-01-19 DIAGNOSIS — I6522 Occlusion and stenosis of left carotid artery: Secondary | ICD-10-CM

## 2023-01-19 DIAGNOSIS — F17210 Nicotine dependence, cigarettes, uncomplicated: Secondary | ICD-10-CM | POA: Diagnosis not present

## 2023-01-19 DIAGNOSIS — M8580 Other specified disorders of bone density and structure, unspecified site: Secondary | ICD-10-CM | POA: Diagnosis not present

## 2023-01-19 DIAGNOSIS — I70223 Atherosclerosis of native arteries of extremities with rest pain, bilateral legs: Secondary | ICD-10-CM

## 2023-01-19 DIAGNOSIS — D692 Other nonthrombocytopenic purpura: Secondary | ICD-10-CM | POA: Diagnosis not present

## 2023-01-19 DIAGNOSIS — I5032 Chronic diastolic (congestive) heart failure: Secondary | ICD-10-CM | POA: Diagnosis not present

## 2023-01-19 DIAGNOSIS — J449 Chronic obstructive pulmonary disease, unspecified: Secondary | ICD-10-CM | POA: Diagnosis not present

## 2023-01-19 DIAGNOSIS — I10 Essential (primary) hypertension: Secondary | ICD-10-CM | POA: Diagnosis not present

## 2023-01-21 DIAGNOSIS — R2681 Unsteadiness on feet: Secondary | ICD-10-CM | POA: Diagnosis not present

## 2023-01-21 DIAGNOSIS — M6281 Muscle weakness (generalized): Secondary | ICD-10-CM | POA: Diagnosis not present

## 2023-01-27 DIAGNOSIS — I2510 Atherosclerotic heart disease of native coronary artery without angina pectoris: Secondary | ICD-10-CM | POA: Diagnosis not present

## 2023-01-27 DIAGNOSIS — I10 Essential (primary) hypertension: Secondary | ICD-10-CM | POA: Diagnosis not present

## 2023-01-27 DIAGNOSIS — I4819 Other persistent atrial fibrillation: Secondary | ICD-10-CM | POA: Diagnosis not present

## 2023-02-04 DIAGNOSIS — M6281 Muscle weakness (generalized): Secondary | ICD-10-CM | POA: Diagnosis not present

## 2023-02-04 DIAGNOSIS — R2681 Unsteadiness on feet: Secondary | ICD-10-CM | POA: Diagnosis not present

## 2023-02-06 DIAGNOSIS — M6281 Muscle weakness (generalized): Secondary | ICD-10-CM | POA: Diagnosis not present

## 2023-02-06 DIAGNOSIS — R2681 Unsteadiness on feet: Secondary | ICD-10-CM | POA: Diagnosis not present

## 2023-05-02 DIAGNOSIS — Z23 Encounter for immunization: Secondary | ICD-10-CM | POA: Diagnosis not present

## 2023-05-04 DIAGNOSIS — I739 Peripheral vascular disease, unspecified: Secondary | ICD-10-CM | POA: Diagnosis not present

## 2023-05-04 DIAGNOSIS — I5032 Chronic diastolic (congestive) heart failure: Secondary | ICD-10-CM | POA: Diagnosis not present

## 2023-05-04 DIAGNOSIS — I4819 Other persistent atrial fibrillation: Secondary | ICD-10-CM | POA: Diagnosis not present

## 2023-05-11 DIAGNOSIS — I10 Essential (primary) hypertension: Secondary | ICD-10-CM | POA: Diagnosis not present

## 2023-05-11 DIAGNOSIS — Z131 Encounter for screening for diabetes mellitus: Secondary | ICD-10-CM | POA: Diagnosis not present

## 2023-05-11 DIAGNOSIS — E7849 Other hyperlipidemia: Secondary | ICD-10-CM | POA: Diagnosis not present

## 2023-05-11 DIAGNOSIS — Z1329 Encounter for screening for other suspected endocrine disorder: Secondary | ICD-10-CM | POA: Diagnosis not present

## 2023-05-11 DIAGNOSIS — F17210 Nicotine dependence, cigarettes, uncomplicated: Secondary | ICD-10-CM | POA: Diagnosis not present

## 2023-05-11 DIAGNOSIS — J449 Chronic obstructive pulmonary disease, unspecified: Secondary | ICD-10-CM | POA: Diagnosis not present

## 2023-05-13 DIAGNOSIS — I5032 Chronic diastolic (congestive) heart failure: Secondary | ICD-10-CM | POA: Diagnosis not present

## 2023-05-18 DIAGNOSIS — D6869 Other thrombophilia: Secondary | ICD-10-CM | POA: Diagnosis not present

## 2023-05-18 DIAGNOSIS — E7849 Other hyperlipidemia: Secondary | ICD-10-CM | POA: Diagnosis not present

## 2023-05-18 DIAGNOSIS — I700 Atherosclerosis of aorta: Secondary | ICD-10-CM | POA: Diagnosis not present

## 2023-05-18 DIAGNOSIS — D692 Other nonthrombocytopenic purpura: Secondary | ICD-10-CM | POA: Diagnosis not present

## 2023-05-18 DIAGNOSIS — I5032 Chronic diastolic (congestive) heart failure: Secondary | ICD-10-CM | POA: Diagnosis not present

## 2023-05-18 DIAGNOSIS — I10 Essential (primary) hypertension: Secondary | ICD-10-CM | POA: Diagnosis not present

## 2023-05-18 DIAGNOSIS — M8580 Other specified disorders of bone density and structure, unspecified site: Secondary | ICD-10-CM | POA: Diagnosis not present

## 2023-05-18 DIAGNOSIS — F17210 Nicotine dependence, cigarettes, uncomplicated: Secondary | ICD-10-CM | POA: Diagnosis not present

## 2023-05-18 DIAGNOSIS — J449 Chronic obstructive pulmonary disease, unspecified: Secondary | ICD-10-CM | POA: Diagnosis not present

## 2023-06-01 DIAGNOSIS — Z87891 Personal history of nicotine dependence: Secondary | ICD-10-CM | POA: Diagnosis not present

## 2023-06-01 DIAGNOSIS — M810 Age-related osteoporosis without current pathological fracture: Secondary | ICD-10-CM | POA: Diagnosis not present

## 2023-06-01 DIAGNOSIS — D6859 Other primary thrombophilia: Secondary | ICD-10-CM | POA: Diagnosis not present

## 2023-06-01 DIAGNOSIS — I4819 Other persistent atrial fibrillation: Secondary | ICD-10-CM | POA: Diagnosis not present

## 2023-06-08 DIAGNOSIS — M810 Age-related osteoporosis without current pathological fracture: Secondary | ICD-10-CM | POA: Diagnosis not present

## 2023-06-08 DIAGNOSIS — D6859 Other primary thrombophilia: Secondary | ICD-10-CM | POA: Diagnosis not present

## 2023-06-09 DIAGNOSIS — Z780 Asymptomatic menopausal state: Secondary | ICD-10-CM | POA: Diagnosis not present

## 2023-06-09 DIAGNOSIS — M8580 Other specified disorders of bone density and structure, unspecified site: Secondary | ICD-10-CM | POA: Diagnosis not present

## 2023-06-15 DIAGNOSIS — I110 Hypertensive heart disease with heart failure: Secondary | ICD-10-CM | POA: Diagnosis not present

## 2023-06-15 DIAGNOSIS — Z7982 Long term (current) use of aspirin: Secondary | ICD-10-CM | POA: Diagnosis not present

## 2023-06-15 DIAGNOSIS — R9431 Abnormal electrocardiogram [ECG] [EKG]: Secondary | ICD-10-CM | POA: Diagnosis not present

## 2023-06-15 DIAGNOSIS — Z79899 Other long term (current) drug therapy: Secondary | ICD-10-CM | POA: Diagnosis not present

## 2023-06-15 DIAGNOSIS — I4819 Other persistent atrial fibrillation: Secondary | ICD-10-CM | POA: Diagnosis not present

## 2023-06-15 DIAGNOSIS — I509 Heart failure, unspecified: Secondary | ICD-10-CM | POA: Diagnosis not present

## 2023-06-15 DIAGNOSIS — I480 Paroxysmal atrial fibrillation: Secondary | ICD-10-CM | POA: Diagnosis not present

## 2023-06-15 DIAGNOSIS — I2510 Atherosclerotic heart disease of native coronary artery without angina pectoris: Secondary | ICD-10-CM | POA: Diagnosis not present

## 2023-06-15 DIAGNOSIS — Z7901 Long term (current) use of anticoagulants: Secondary | ICD-10-CM | POA: Diagnosis not present

## 2023-06-15 DIAGNOSIS — I10 Essential (primary) hypertension: Secondary | ICD-10-CM | POA: Diagnosis not present

## 2023-06-15 DIAGNOSIS — Z87891 Personal history of nicotine dependence: Secondary | ICD-10-CM | POA: Diagnosis not present

## 2023-06-16 DIAGNOSIS — I480 Paroxysmal atrial fibrillation: Secondary | ICD-10-CM | POA: Diagnosis not present

## 2023-08-03 DIAGNOSIS — I480 Paroxysmal atrial fibrillation: Secondary | ICD-10-CM | POA: Diagnosis not present

## 2023-08-03 DIAGNOSIS — I739 Peripheral vascular disease, unspecified: Secondary | ICD-10-CM | POA: Diagnosis not present

## 2023-08-03 DIAGNOSIS — E782 Mixed hyperlipidemia: Secondary | ICD-10-CM | POA: Diagnosis not present

## 2023-08-24 DIAGNOSIS — E782 Mixed hyperlipidemia: Secondary | ICD-10-CM | POA: Diagnosis not present

## 2023-08-24 DIAGNOSIS — E559 Vitamin D deficiency, unspecified: Secondary | ICD-10-CM | POA: Diagnosis not present

## 2023-08-24 DIAGNOSIS — Z1321 Encounter for screening for nutritional disorder: Secondary | ICD-10-CM | POA: Diagnosis not present

## 2023-08-24 DIAGNOSIS — Z0001 Encounter for general adult medical examination with abnormal findings: Secondary | ICD-10-CM | POA: Diagnosis not present

## 2023-08-24 DIAGNOSIS — Z131 Encounter for screening for diabetes mellitus: Secondary | ICD-10-CM | POA: Diagnosis not present

## 2023-08-24 DIAGNOSIS — Z1329 Encounter for screening for other suspected endocrine disorder: Secondary | ICD-10-CM | POA: Diagnosis not present

## 2023-08-24 DIAGNOSIS — Z23 Encounter for immunization: Secondary | ICD-10-CM | POA: Diagnosis not present

## 2023-08-24 DIAGNOSIS — I10 Essential (primary) hypertension: Secondary | ICD-10-CM | POA: Diagnosis not present

## 2023-08-26 DIAGNOSIS — Z23 Encounter for immunization: Secondary | ICD-10-CM | POA: Diagnosis not present

## 2023-08-26 DIAGNOSIS — Z0001 Encounter for general adult medical examination with abnormal findings: Secondary | ICD-10-CM | POA: Diagnosis not present

## 2023-08-26 DIAGNOSIS — E782 Mixed hyperlipidemia: Secondary | ICD-10-CM | POA: Diagnosis not present

## 2023-08-26 DIAGNOSIS — I779 Disorder of arteries and arterioles, unspecified: Secondary | ICD-10-CM | POA: Diagnosis not present

## 2023-08-26 DIAGNOSIS — M5117 Intervertebral disc disorders with radiculopathy, lumbosacral region: Secondary | ICD-10-CM | POA: Diagnosis not present

## 2023-08-26 DIAGNOSIS — J449 Chronic obstructive pulmonary disease, unspecified: Secondary | ICD-10-CM | POA: Diagnosis not present

## 2023-08-26 DIAGNOSIS — Z6828 Body mass index (BMI) 28.0-28.9, adult: Secondary | ICD-10-CM | POA: Diagnosis not present

## 2023-10-02 ENCOUNTER — Other Ambulatory Visit: Payer: Self-pay

## 2023-10-02 DIAGNOSIS — I70223 Atherosclerosis of native arteries of extremities with rest pain, bilateral legs: Secondary | ICD-10-CM

## 2023-10-02 DIAGNOSIS — I6529 Occlusion and stenosis of unspecified carotid artery: Secondary | ICD-10-CM

## 2023-10-14 ENCOUNTER — Encounter: Payer: Self-pay | Admitting: Vascular Surgery

## 2023-10-14 ENCOUNTER — Ambulatory Visit: Payer: Medicare PPO | Admitting: Vascular Surgery

## 2023-10-14 ENCOUNTER — Ambulatory Visit (INDEPENDENT_AMBULATORY_CARE_PROVIDER_SITE_OTHER)
Admission: RE | Admit: 2023-10-14 | Discharge: 2023-10-14 | Disposition: A | Discharge status: Home / Self Care | Payer: Medicare PPO | Source: Ambulatory Visit | Attending: Vascular Surgery | Admitting: Vascular Surgery

## 2023-10-14 ENCOUNTER — Ambulatory Visit (HOSPITAL_COMMUNITY)
Admission: RE | Admit: 2023-10-14 | Discharge: 2023-10-14 | Disposition: A | Discharge status: Home / Self Care | Payer: Medicare PPO | Source: Ambulatory Visit | Attending: Vascular Surgery | Admitting: Vascular Surgery

## 2023-10-14 VITALS — BP 103/70 | HR 72 | Temp 97.8°F | Resp 20 | Ht 61.0 in | Wt 152.0 lb

## 2023-10-14 DIAGNOSIS — I70223 Atherosclerosis of native arteries of extremities with rest pain, bilateral legs: Secondary | ICD-10-CM | POA: Diagnosis not present

## 2023-10-14 DIAGNOSIS — I6529 Occlusion and stenosis of unspecified carotid artery: Secondary | ICD-10-CM | POA: Diagnosis not present

## 2023-10-14 DIAGNOSIS — I70221 Atherosclerosis of native arteries of extremities with rest pain, right leg: Secondary | ICD-10-CM

## 2023-10-14 LAB — VAS US ABI WITH/WO TBI
Left ABI: 0.45
Right ABI: 0.28

## 2023-10-14 NOTE — Progress Notes (Signed)
 Patient ID: Angela Stein, female   DOB: 04/05/1951, 73 y.o.   MRN: [id number]  Reason for Consult: Follow-up   Referred by Richardean Chimera, MD  Subjective:     HPI: Angela Stein is a 73 y.o. female has a history of carotid endarterectomy by Dr. Edilia Bo also has undergone angiography in the past just over a year ago for lower extremity claudication.  She now states that her right lower extremity severely limits her walking and occasionally hurts at rest.  She denies any tissue loss or ulceration but is very diligent to protect her feet.  She has not been a non-smoker for 2 years.  She has associated hypertension.  Past Medical History:  Diagnosis Date   Atrial fibrillation [facility])    Carotid artery occlusion    Coronary artery disease    Dysrhythmia    Hypertension    History reviewed. No pertinent family history. Past Surgical History:  Procedure Laterality Date   ABDOMINAL AORTOGRAM W/LOWER EXTREMITY Bilateral 04/11/2022   Procedure: ABDOMINAL AORTOGRAM W/LOWER EXTREMITY;  Surgeon: Chuck Hint, MD;  Location: [facility] INVASIVE CV LAB;  Service: Cardiovascular;  Laterality: Bilateral;   ABDOMINAL HYSTERECTOMY     AORTIC ARCH ANGIOGRAPHY Bilateral 04/11/2022   Procedure: AORTIC ARCH ANGIOGRAPHY;  Surgeon: Chuck Hint, MD;  Location: [facility] INVASIVE CV LAB;  Service: Cardiovascular;  Laterality: Bilateral;   COLONOSCOPY N/A 06/15/2019   Procedure: COLONOSCOPY;  Surgeon: Malissa Hippo, MD;  [facility];  Service: Endoscopy;  Laterality: N/A;   DILATION AND CURETTAGE OF UTERUS     ENDARTERECTOMY Left 12/23/2022   Procedure: ENDARTERECTOMY CAROTID;  Surgeon: Chuck Hint, MD;  Location: [facility] OR;  Service: Vascular;  Laterality: Left;   PATCH ANGIOPLASTY Left 12/23/2022   Procedure: PATCH ANGIOPLASTY LEFT CAROTID ARTERY WIHT 1CM x 14CM BOVINE PATCH;  Surgeon: Chuck Hint, MD;  Location: [facility] OR;  Service: Vascular;  Laterality: Left;   POLYPECTOMY   06/15/2019   Procedure: POLYPECTOMY;  Surgeon: Malissa Hippo, MD;  [facility];  Service: Endoscopy;;  cold snare rectosigmoid polyp   TONSILLECTOMY      Short Social History:  Social History   Tobacco Use   Smoking status: Former    Current packs/day: 0.00    Types: Cigarettes    Quit date: 04/2022    Years since quitting: 1.5   Smokeless tobacco: Never  Substance Use Topics   Alcohol use: Not Currently    Comment: Once a year    Allergies  Allergen Reactions   Ciprofloxacin Hives   Lisinopril Swelling    Current Outpatient Medications  Medication Sig Dispense Refill   amLODipine (NORVASC) 5 MG tablet Take 5 mg by mouth in the morning.     apixaban (ELIQUIS) 5 MG TABS tablet Take 1 tablet (5 mg total) by mouth 2 (two) times daily. 60 tablet    aspirin EC 81 MG tablet Take 81 mg by mouth every evening. Swallow whole.     Calcium Carbonate (CALCIUM 600 PO) Take 600 mg by mouth 2 (two) times daily.     Cholecalciferol (VITAMIN D3) 25 MCG (1000 UT) CAPS Take 1,000 Units by mouth 2 (two) times daily.      dofetilide (TIKOSYN) 500 MCG capsule Take 500 mcg by mouth every 12 (twelve) hours.     ezetimibe (ZETIA) 10 MG tablet Take 10 mg by mouth every evening.     furosemide (LASIX) 40 MG tablet Take 20-40 mg  by mouth daily as needed for fluid.     losartan (COZAAR) 50 MG tablet Take 50 mg by mouth 2 (two) times daily.     metoprolol tartrate (LOPRESSOR) 50 MG tablet Take 75 mg by mouth 2 (two) times daily.      rosuvastatin (CRESTOR) 10 MG tablet Take 1 tablet (10 mg total) by mouth daily. 90 tablet 3   saccharomyces boulardii (FLORASTOR) 250 MG capsule Take 250 mg by mouth daily.     valACYclovir (VALTREX) 1000 MG tablet Take 1,000 mg by mouth daily as needed (shingles/mouth ulcers).     No current facility-administered medications for this visit.    Review of Systems  Constitutional:  Constitutional negative. HENT: HENT negative.  Eyes: Eyes negative.   Respiratory: Positive for shortness of breath.  Cardiovascular: Positive for claudication.  GI: Gastrointestinal negative.  Musculoskeletal: Positive for leg pain.  Skin: Skin negative.  Neurological: Neurological negative. Hematologic: Hematologic/lymphatic negative.  Psychiatric: Psychiatric negative.        Objective:  Objective   Vitals:   10/14/23 1348  BP: 103/70  Pulse: 72  Resp: 20  Temp: 97.8 F (36.6 C)  SpO2: 93%  Weight: 152 lb (68.9 kg)  Height: 5\' 1"  (1.549 m)   Body mass index is 28.72 kg/m.  Physical Exam HENT:     Head: Normocephalic.     Nose: Nose normal.  Eyes:     Pupils: Pupils are equal, round, and reactive to light.  Neck:     Comments: Well-healed left neck incision Cardiovascular:     Pulses:          Femoral pulses are 0 on the right side and 0 on the left side. Pulmonary:     Effort: Pulmonary effort is normal.  Abdominal:     General: Abdomen is flat.     Palpations: Abdomen is soft. There is no mass.  Musculoskeletal:     Cervical back: Neck supple.     Right lower leg: No edema.     Left lower leg: No edema.  Skin:    Capillary Refill: Capillary refill takes more than 3 seconds.  Neurological:     General: No focal deficit present.     Mental Status: She is alert.  Psychiatric:        Mood and Affect: Mood normal.        Thought Content: Thought content normal.     Data: ABI Findings:  +---------+------------------+-----+----------+--------+  Right   Rt Pressure (mmHg)IndexWaveform  Comment   +---------+------------------+-----+----------+--------+  Brachial 162                                        +---------+------------------+-----+----------+--------+  ATA     49                0.28 monophasic          +---------+------------------+-----+----------+--------+  PTA     35                0.20 monophasic          +---------+------------------+-----+----------+--------+  Great Toe0                  0.00 Absent              +---------+------------------+-----+----------+--------+   +---------+------------------+-----+----------+-------+  Left    Lt Pressure (mmHg)IndexWaveform  Comment  +---------+------------------+-----+----------+-------+  Brachial 172                                       +---------+------------------+-----+----------+-------+  ATA     78                0.45 monophasic         +---------+------------------+-----+----------+-------+  PTA     45                0.26 monophasic         +---------+------------------+-----+----------+-------+  Great Toe94                0.55                    +---------+------------------+-----+----------+-------+   +-------+-----------+-----------+------------+------------+  ABI/TBIToday's ABIToday's TBIPrevious ABIPrevious TBI  +-------+-----------+-----------+------------+------------+  Right 0.28       0          0.31        0             +-------+-----------+-----------+------------+------------+  Left  0.45       0.55       0.48        0             +-------+-----------+-----------+------------+------------+        Previous ABI on 12/18/22.    Summary:  Right: Resting right ankle-brachial index indicates critical limb  ischemia. The right toe-brachial index is abnormal.   Left: Resting left ankle-brachial index indicates severe left lower  extremity arterial disease. The left toe-brachial index is abnormal.    Right Carotid Findings:  +----------+--------+--------+--------+-------------------------+--------+           PSV cm/sEDV cm/sStenosisPlaque Description       Comments  +----------+--------+--------+--------+-------------------------+--------+  CCA Prox  68      13                                                 +----------+--------+--------+--------+-------------------------+--------+  CCA Mid   73      15              heterogenous  and calcific          +----------+--------+--------+--------+-------------------------+--------+  CCA Distal104     19              heterogenous                       +----------+--------+--------+--------+-------------------------+--------+  ICA Prox  131     32              heterogenous and calcific          +----------+--------+--------+--------+-------------------------+--------+  ICA Mid   81      21                                                 +----------+--------+--------+--------+-------------------------+--------+  ICA Distal60      15                                                 +----------+--------+--------+--------+-------------------------+--------+  ECA      159     22  heterogenous                       +----------+--------+--------+--------+-------------------------+--------+   +----------+--------+-------+----------------+-------------------+           PSV cm/sEDV cmsDescribe        Arm Pressure (mmHG)  +----------+--------+-------+----------------+-------------------+  [id number]           Multiphasic, [id number]                  +----------+--------+-------+----------------+-------------------+   +---------+--------+--+--------+-+---------+  VertebralPSV cm/s38EDV cm/s8Antegrade  +---------+--------+--+--------+-+---------+      Left Carotid Findings:  +----------+--------+--------+--------+------------------+--------+           PSV cm/sEDV cm/sStenosisPlaque DescriptionComments  +----------+--------+--------+--------+------------------+--------+  CCA Prox  91      19              heterogenous                +----------+--------+--------+--------+------------------+--------+  CCA Mid   88      17                                          +----------+--------+--------+--------+------------------+--------+  CCA Distal100     16                                           +----------+--------+--------+--------+------------------+--------+  ICA Prox  64      17      1-39%   heterogenous                +----------+--------+--------+--------+------------------+--------+  ICA Mid   100     25                                          +----------+--------+--------+--------+------------------+--------+  ICA Distal61      21                                          +----------+--------+--------+--------+------------------+--------+  ECA      96      14                                          +----------+--------+--------+--------+------------------+--------+   +----------+--------+--------+----------------+-------------------+           PSV cm/sEDV cm/sDescribe        Arm Pressure (mmHG)  +----------+--------+--------+----------------+-------------------+  Subclavian200            Multiphasic, [id number]                  +----------+--------+--------+----------------+-------------------+   +---------+--------+--+--------+--+---------+  VertebralPSV cm/s79EDV cm/s13Antegrade  +---------+--------+--+--------+--+---------+         Summary:  Right Carotid: Velocities in the right ICA are consistent with a 1-39%  stenosis.   Left Carotid: Velocities in the left ICA are consistent with a 1-39%  stenosis.   Vertebrals: Bilateral vertebral arteries demonstrate antegrade flow.  Subclavians: Normal flow hemodynamics were seen in bilateral subclavian  arteries.      Assessment/Plan:     73 year old female with severely lifelong right lower extremity patient with occasional right lower extremity rest pain and severely depressed ABIs on the right and somewhat better on the left where she is mostly asymptomatic.  I have recommended the need to continue protecting her feet we discussed this today which she is very diligent about.  I reviewed her previous angiography and discussed with her the options which  would be possible advanced endovascular revascularization possibly with Endologix graft versus stenting of the left side with a left to right femorofemoral bypass versus more invasively aortobifemoral bypass which I am not sure she would tolerate and possibly she could have axillary to bifemoral bypass.  Either way we will obtain CT scan and she will follow-up in a few weeks to discuss her options.     Maeola Harman MD [facility]

## 2023-10-19 ENCOUNTER — Other Ambulatory Visit: Payer: Self-pay

## 2023-10-19 DIAGNOSIS — I70221 Atherosclerosis of native arteries of extremities with rest pain, right leg: Secondary | ICD-10-CM

## 2023-10-29 DIAGNOSIS — Z1231 Encounter for screening mammogram for malignant neoplasm of breast: Secondary | ICD-10-CM | POA: Diagnosis not present

## 2023-11-02 DIAGNOSIS — I739 Peripheral vascular disease, unspecified: Secondary | ICD-10-CM | POA: Diagnosis not present

## 2023-11-12 ENCOUNTER — Ambulatory Visit (HOSPITAL_COMMUNITY)
Admission: RE | Admit: 2023-11-12 | Discharge: 2023-11-12 | Disposition: A | Discharge status: Home / Self Care | Source: Ambulatory Visit | Attending: Vascular Surgery | Admitting: Vascular Surgery

## 2023-11-12 DIAGNOSIS — I70203 Unspecified atherosclerosis of native arteries of extremities, bilateral legs: Secondary | ICD-10-CM | POA: Diagnosis not present

## 2023-11-12 DIAGNOSIS — I70221 Atherosclerosis of native arteries of extremities with rest pain, right leg: Secondary | ICD-10-CM | POA: Insufficient documentation

## 2023-11-12 DIAGNOSIS — Q272 Other congenital malformations of renal artery: Secondary | ICD-10-CM | POA: Diagnosis not present

## 2023-11-12 DIAGNOSIS — I701 Atherosclerosis of renal artery: Secondary | ICD-10-CM | POA: Diagnosis not present

## 2023-11-12 DIAGNOSIS — N281 Cyst of kidney, acquired: Secondary | ICD-10-CM | POA: Diagnosis not present

## 2023-11-12 MED ORDER — IOHEXOL 350 MG/ML SOLN
125.0000 mL | Freq: Once | INTRAVENOUS | Status: AC | PRN
  Administered 2023-11-12: 125 mL via INTRAVENOUS

## 2023-11-13 DIAGNOSIS — D3132 Benign neoplasm of left choroid: Secondary | ICD-10-CM | POA: Diagnosis not present

## 2023-11-18 ENCOUNTER — Encounter: Payer: Self-pay | Admitting: Vascular Surgery

## 2023-11-18 ENCOUNTER — Ambulatory Visit: Admitting: Vascular Surgery

## 2023-11-18 VITALS — BP 139/84 | HR 67 | Temp 98.0°F | Ht 61.0 in | Wt 157.0 lb

## 2023-11-18 DIAGNOSIS — I70221 Atherosclerosis of native arteries of extremities with rest pain, right leg: Secondary | ICD-10-CM | POA: Diagnosis not present

## 2023-11-18 NOTE — Progress Notes (Signed)
 Patient ID: Angela Stein, female   DOB: 30-Aug-1950, 73 y.o.   MRN: [id number]  Reason for Consult: Follow-up   Referred by Richardean Chimera, MD  Subjective:     HPI:  Angela Stein is a 73 y.o. female previously followed by Dr. Edilia Bo and had undergone carotid endarterectomy.  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  Patient is a former smoker but quit over 2 years ago and also has hypertension has risk factors for vascular disease.  She has claudication that is short distance based on the right lower extremity and has had even occasional rest pain.  She states that she can walk approximately 3 to 4 minutes on flat ground but does have pain in the right hip and buttock and also pain that causes her to stop in the right calf.  She has had occasional pain in the middle the night that has woken her from sleep previously she would only have occasional pain but she has never woke up from this.  She continues to deny tissue loss or ulceration.  Past Medical History:  Diagnosis Date   Atrial fibrillation [facility])    Carotid artery occlusion    Coronary artery disease    Dysrhythmia    Hypertension    History reviewed. No pertinent family history. Past Surgical History:  Procedure Laterality Date   ABDOMINAL AORTOGRAM W/LOWER EXTREMITY Bilateral 04/11/2022   Procedure: ABDOMINAL AORTOGRAM W/LOWER EXTREMITY;  Surgeon: Chuck Hint, MD;  Location: [facility] INVASIVE CV LAB;  Service: Cardiovascular;  Laterality: Bilateral;   ABDOMINAL HYSTERECTOMY     AORTIC ARCH ANGIOGRAPHY Bilateral 04/11/2022   Procedure: AORTIC ARCH ANGIOGRAPHY;  Surgeon: Chuck Hint, MD;  Location: [facility] INVASIVE CV LAB;  Service: Cardiovascular;  Laterality: Bilateral;   COLONOSCOPY N/A 06/15/2019   Procedure: COLONOSCOPY;  Surgeon: Malissa Hippo, MD;  [facility];  Service: Endoscopy;   Laterality: N/A;   DILATION AND CURETTAGE OF UTERUS     ENDARTERECTOMY Left 12/23/2022   Procedure: ENDARTERECTOMY CAROTID;  Surgeon: Chuck Hint, MD;  Location: [facility] OR;  Service: Vascular;  Laterality: Left;   PATCH ANGIOPLASTY Left 12/23/2022   Procedure: PATCH ANGIOPLASTY LEFT CAROTID ARTERY WIHT 1CM x 14CM BOVINE PATCH;  Surgeon: Chuck Hint, MD;  Location: [facility] OR;  Service: Vascular;  Laterality: Left;   POLYPECTOMY  06/15/2019   Procedure: POLYPECTOMY;  Surgeon: Malissa Hippo, MD;  [facility];  Service: Endoscopy;;  cold snare rectosigmoid polyp   TONSILLECTOMY      Short Social History:  Social History   Tobacco Use   Smoking status: Former    Current packs/day: 0.00    Types: Cigarettes    Quit date: 04/2022    Years since quitting: 1.6   Smokeless tobacco: Never  Substance Use Topics   Alcohol use: Not Currently    Comment: Once a year    Allergies  Allergen Reactions   Ciprofloxacin Hives   Lisinopril Swelling    Current Outpatient Medications  Medication Sig Dispense Refill   amLODipine (NORVASC) 5 MG tablet Take 5 mg by mouth in the morning.     apixaban (ELIQUIS) 5 MG TABS tablet Take 1 tablet (5 mg total) by mouth 2 (two) times daily. 60 tablet    aspirin EC 81 MG tablet Take 81 mg by mouth every evening. Swallow whole.  Calcium Carbonate (CALCIUM 600 PO) Take 600 mg by mouth 2 (two) times daily.     Cholecalciferol (VITAMIN D3) 25 MCG (1000 UT) CAPS Take 1,000 Units by mouth 2 (two) times daily.      dofetilide (TIKOSYN) 500 MCG capsule Take 500 mcg by mouth every 12 (twelve) hours.     ezetimibe (ZETIA) 10 MG tablet Take 10 mg by mouth every evening.     furosemide (LASIX) 40 MG tablet Take 20-40 mg by mouth daily as needed for fluid.     losartan (COZAAR) 50 MG tablet Take 50 mg by mouth 2 (two) times daily.     metoprolol tartrate (LOPRESSOR) 50 MG tablet Take 75 mg by mouth 2 (two) times daily.      rosuvastatin  (CRESTOR) 10 MG tablet Take 1 tablet (10 mg total) by mouth daily. 90 tablet 3   saccharomyces boulardii (FLORASTOR) 250 MG capsule Take 250 mg by mouth daily.     valACYclovir (VALTREX) 1000 MG tablet Take 1,000 mg by mouth daily as needed (shingles/mouth ulcers).     No current facility-administered medications for this visit.    Review of Systems  Constitutional:  Constitutional negative. HENT: HENT negative.  Eyes: Eyes negative.  Cardiovascular: Positive for claudication.  GI: Gastrointestinal negative.  Musculoskeletal: Positive for leg pain.  Neurological: Neurological negative. Hematologic: Hematologic/lymphatic negative.  Psychiatric: Psychiatric negative.        Objective:  Objective   Vitals:   11/18/23 1428  BP: 139/84  Pulse: 67  Temp: 98 F (36.7 C)  SpO2: 93%  Weight: 157 lb (71.2 kg)  Height: 5\' 1"  (1.549 m)   Body mass index is 29.66 kg/m.  Physical Exam HENT:     Head: Normocephalic.     Nose: Nose normal.  Eyes:     Pupils: Pupils are equal, round, and reactive to light.  Cardiovascular:     Rate and Rhythm: Normal rate.     Pulses:          Radial pulses are 2+ on the right side and 2+ on the left side.       Femoral pulses are 0 on the right side and 0 on the left side. Pulmonary:     Effort: Pulmonary effort is normal.  Abdominal:     General: Abdomen is flat.  Skin:    General: Skin is warm.  Neurological:     General: No focal deficit present.     Mental Status: She is alert.  Psychiatric:        Mood and Affect: Mood normal.        Thought Content: Thought content normal.        Judgment: Judgment normal.     Data: ABI Findings:  +---------+------------------+-----+----------+--------+  Right   Rt Pressure (mmHg)IndexWaveform  Comment   +---------+------------------+-----+----------+--------+  Brachial 162                                        +---------+------------------+-----+----------+--------+  ATA      49                0.28 monophasic          +---------+------------------+-----+----------+--------+  PTA     35                0.20 monophasic          +---------+------------------+-----+----------+--------+  [place]  Toe0                 0.00 Absent              +---------+------------------+-----+----------+--------+   +---------+------------------+-----+----------+-------+  Left    Lt Pressure (mmHg)IndexWaveform  Comment  +---------+------------------+-----+----------+-------+  Brachial 172                                       +---------+------------------+-----+----------+-------+  ATA     78                0.45 monophasic         +---------+------------------+-----+----------+-------+  PTA     45                0.26 monophasic         +---------+------------------+-----+----------+-------+  Great Toe94                0.55                    +---------+------------------+-----+----------+-------+   +-------+-----------+-----------+------------+------------+  ABI/TBIToday's ABIToday's TBIPrevious ABIPrevious TBI  +-------+-----------+-----------+------------+------------+  Right 0.28       0          0.31        0             +-------+-----------+-----------+------------+------------+  Left  0.45       0.55       0.48        0             +-------+-----------+-----------+------------+------------+        Previous ABI on 12/18/22.    Summary:  Right: Resting right ankle-brachial index indicates critical limb  ischemia. The right toe-brachial index is abnormal.   Left: Resting left ankle-brachial index indicates severe left lower  extremity arterial disease. The left toe-brachial index is abnormal.       Assessment/Plan:    73 year old female with history of severely depressed ABIs bilaterally but thankfully is mostly a claudicant with occasional rest pain and no evidence of tissue loss at this time.  We have  discussed her options being continued watchful waiting versus surgical intervention.  Her intervention options would include stenting of her aorta into her left common iliac artery with left to right femorofemoral bypass versus aortobifemoral bypass versus axillary to bifemoral bypass.  After discussing her options she appears to be best suited with axillary to bifemoral bypass and would plan for left axillary artery as the inflow given that she does have left upper extremity shoulder disability from previous injury.  She has readily palpable radial pulse bilaterally and has undergone angiography in the past from the left brachial approach that did not show any subclavian or axillary stenosis.  Patient would need cardiac clearance prior to any procedure but currently she would like a second opinion I will refer her to [facility] where she has been treated by cardiology in the past.  She will call to follow-up with [place] on an as-needed basis.  Maeola Harman MD [facility]

## 2023-11-30 DIAGNOSIS — M810 Age-related osteoporosis without current pathological fracture: Secondary | ICD-10-CM | POA: Diagnosis not present

## 2023-11-30 DIAGNOSIS — D6859 Other primary thrombophilia: Secondary | ICD-10-CM | POA: Diagnosis not present

## 2023-12-02 DIAGNOSIS — Z9889 Other specified postprocedural states: Secondary | ICD-10-CM | POA: Diagnosis not present

## 2023-12-02 DIAGNOSIS — I70221 Atherosclerosis of native arteries of extremities with rest pain, right leg: Secondary | ICD-10-CM | POA: Diagnosis not present

## 2023-12-02 DIAGNOSIS — I5030 Unspecified diastolic (congestive) heart failure: Secondary | ICD-10-CM | POA: Diagnosis not present

## 2023-12-02 DIAGNOSIS — I739 Peripheral vascular disease, unspecified: Secondary | ICD-10-CM | POA: Diagnosis not present

## 2023-12-02 DIAGNOSIS — I745 Embolism and thrombosis of iliac artery: Secondary | ICD-10-CM | POA: Diagnosis not present

## 2023-12-02 DIAGNOSIS — I110 Hypertensive heart disease with heart failure: Secondary | ICD-10-CM | POA: Diagnosis not present

## 2023-12-02 DIAGNOSIS — I4891 Unspecified atrial fibrillation: Secondary | ICD-10-CM | POA: Diagnosis not present

## 2023-12-02 DIAGNOSIS — I700 Atherosclerosis of aorta: Secondary | ICD-10-CM | POA: Diagnosis not present

## 2023-12-02 DIAGNOSIS — I708 Atherosclerosis of other arteries: Secondary | ICD-10-CM | POA: Diagnosis not present

## 2023-12-02 DIAGNOSIS — I2510 Atherosclerotic heart disease of native coronary artery without angina pectoris: Secondary | ICD-10-CM | POA: Diagnosis not present

## 2023-12-02 DIAGNOSIS — I70223 Atherosclerosis of native arteries of extremities with rest pain, bilateral legs: Secondary | ICD-10-CM | POA: Diagnosis not present

## 2023-12-07 DIAGNOSIS — H26493 Other secondary cataract, bilateral: Secondary | ICD-10-CM | POA: Diagnosis not present

## 2023-12-07 DIAGNOSIS — M810 Age-related osteoporosis without current pathological fracture: Secondary | ICD-10-CM | POA: Diagnosis not present

## 2023-12-07 DIAGNOSIS — D6859 Other primary thrombophilia: Secondary | ICD-10-CM | POA: Diagnosis not present

## 2023-12-10 ENCOUNTER — Encounter: Payer: Self-pay | Admitting: Pharmacy Technician

## 2023-12-10 ENCOUNTER — Other Ambulatory Visit (HOSPITAL_COMMUNITY): Payer: Self-pay

## 2023-12-10 NOTE — Telephone Encounter (Signed)
 Closing not our patient -came in cmm

## 2023-12-13 ENCOUNTER — Other Ambulatory Visit: Payer: Self-pay | Admitting: Physician Assistant

## 2023-12-14 DIAGNOSIS — I110 Hypertensive heart disease with heart failure: Secondary | ICD-10-CM | POA: Diagnosis not present

## 2023-12-14 DIAGNOSIS — I5030 Unspecified diastolic (congestive) heart failure: Secondary | ICD-10-CM | POA: Diagnosis not present

## 2023-12-14 DIAGNOSIS — I739 Peripheral vascular disease, unspecified: Secondary | ICD-10-CM | POA: Diagnosis not present

## 2023-12-14 DIAGNOSIS — I4811 Longstanding persistent atrial fibrillation: Secondary | ICD-10-CM | POA: Diagnosis not present

## 2023-12-14 DIAGNOSIS — I480 Paroxysmal atrial fibrillation: Secondary | ICD-10-CM | POA: Diagnosis not present

## 2023-12-18 NOTE — Telephone Encounter (Signed)
 Follow up with PCP

## 2024-02-09 DIAGNOSIS — I10 Essential (primary) hypertension: Secondary | ICD-10-CM | POA: Diagnosis not present

## 2024-02-09 DIAGNOSIS — Z7982 Long term (current) use of aspirin: Secondary | ICD-10-CM | POA: Diagnosis not present

## 2024-02-09 DIAGNOSIS — Z01818 Encounter for other preprocedural examination: Secondary | ICD-10-CM | POA: Diagnosis not present

## 2024-02-09 DIAGNOSIS — I4891 Unspecified atrial fibrillation: Secondary | ICD-10-CM | POA: Diagnosis not present

## 2024-02-09 DIAGNOSIS — I5032 Chronic diastolic (congestive) heart failure: Secondary | ICD-10-CM | POA: Diagnosis not present

## 2024-02-09 DIAGNOSIS — I110 Hypertensive heart disease with heart failure: Secondary | ICD-10-CM | POA: Diagnosis not present

## 2024-02-09 DIAGNOSIS — M810 Age-related osteoporosis without current pathological fracture: Secondary | ICD-10-CM | POA: Diagnosis not present

## 2024-02-09 DIAGNOSIS — I2510 Atherosclerotic heart disease of native coronary artery without angina pectoris: Secondary | ICD-10-CM | POA: Diagnosis not present

## 2024-02-09 DIAGNOSIS — I739 Peripheral vascular disease, unspecified: Secondary | ICD-10-CM | POA: Diagnosis not present

## 2024-02-09 DIAGNOSIS — F17211 Nicotine dependence, cigarettes, in remission: Secondary | ICD-10-CM | POA: Diagnosis not present

## 2024-02-09 DIAGNOSIS — D6859 Other primary thrombophilia: Secondary | ICD-10-CM | POA: Diagnosis not present

## 2024-02-09 DIAGNOSIS — I5030 Unspecified diastolic (congestive) heart failure: Secondary | ICD-10-CM | POA: Diagnosis not present

## 2024-02-10 DIAGNOSIS — I480 Paroxysmal atrial fibrillation: Secondary | ICD-10-CM | POA: Diagnosis not present

## 2024-02-11 DIAGNOSIS — N3000 Acute cystitis without hematuria: Secondary | ICD-10-CM | POA: Diagnosis not present

## 2024-02-18 DIAGNOSIS — N3000 Acute cystitis without hematuria: Secondary | ICD-10-CM | POA: Diagnosis not present

## 2024-02-18 DIAGNOSIS — J0100 Acute maxillary sinusitis, unspecified: Secondary | ICD-10-CM | POA: Diagnosis not present

## 2024-02-18 DIAGNOSIS — Z6830 Body mass index (BMI) 30.0-30.9, adult: Secondary | ICD-10-CM | POA: Diagnosis not present

## 2024-02-18 DIAGNOSIS — R300 Dysuria: Secondary | ICD-10-CM | POA: Diagnosis not present

## 2024-03-07 DIAGNOSIS — I5032 Chronic diastolic (congestive) heart failure: Secondary | ICD-10-CM | POA: Diagnosis not present

## 2024-03-07 DIAGNOSIS — I2510 Atherosclerotic heart disease of native coronary artery without angina pectoris: Secondary | ICD-10-CM | POA: Diagnosis not present

## 2024-03-07 DIAGNOSIS — R9431 Abnormal electrocardiogram [ECG] [EKG]: Secondary | ICD-10-CM | POA: Diagnosis not present

## 2024-03-07 DIAGNOSIS — I10 Essential (primary) hypertension: Secondary | ICD-10-CM | POA: Diagnosis not present

## 2024-03-07 DIAGNOSIS — I4811 Longstanding persistent atrial fibrillation: Secondary | ICD-10-CM | POA: Diagnosis not present

## 2024-03-07 DIAGNOSIS — I739 Peripheral vascular disease, unspecified: Secondary | ICD-10-CM | POA: Diagnosis not present

## 2024-03-08 DIAGNOSIS — I110 Hypertensive heart disease with heart failure: Secondary | ICD-10-CM | POA: Diagnosis not present

## 2024-03-08 DIAGNOSIS — I771 Stricture of artery: Secondary | ICD-10-CM | POA: Diagnosis not present

## 2024-03-08 DIAGNOSIS — I700 Atherosclerosis of aorta: Secondary | ICD-10-CM | POA: Diagnosis not present

## 2024-03-08 DIAGNOSIS — Z90710 Acquired absence of both cervix and uterus: Secondary | ICD-10-CM | POA: Diagnosis not present

## 2024-03-08 DIAGNOSIS — Z79899 Other long term (current) drug therapy: Secondary | ICD-10-CM | POA: Diagnosis not present

## 2024-03-08 DIAGNOSIS — I7409 Other arterial embolism and thrombosis of abdominal aorta: Secondary | ICD-10-CM | POA: Diagnosis not present

## 2024-03-08 DIAGNOSIS — I4891 Unspecified atrial fibrillation: Secondary | ICD-10-CM | POA: Diagnosis not present

## 2024-03-08 DIAGNOSIS — E785 Hyperlipidemia, unspecified: Secondary | ICD-10-CM | POA: Diagnosis not present

## 2024-03-08 DIAGNOSIS — I5032 Chronic diastolic (congestive) heart failure: Secondary | ICD-10-CM | POA: Diagnosis not present

## 2024-03-08 DIAGNOSIS — I708 Atherosclerosis of other arteries: Secondary | ICD-10-CM | POA: Diagnosis not present

## 2024-03-08 DIAGNOSIS — I2510 Atherosclerotic heart disease of native coronary artery without angina pectoris: Secondary | ICD-10-CM | POA: Diagnosis not present

## 2024-03-08 DIAGNOSIS — Z87891 Personal history of nicotine dependence: Secondary | ICD-10-CM | POA: Diagnosis not present

## 2024-03-08 DIAGNOSIS — I70223 Atherosclerosis of native arteries of extremities with rest pain, bilateral legs: Secondary | ICD-10-CM | POA: Diagnosis not present

## 2024-03-09 NOTE — Consults (Signed)
 WOCN Consult Services                                                                Wound Evaluation   Reason for Consult:  - Initial - Wound  Problem List:  Active Problems:   * No active hospital problems. *  Assessment: Per EMR, Angela Stein is a 73 y.o. female, with HTN, afib on eliquis, HFpEF, 1 Day Post-Op s/p Bilateral kissing iliac stents placed. Left common femoral endarterectomy . She is doing well post operatively.   We were consulted by nursing to evaluate a wound to the RUE s/p IV infiltration (infusing simple fluids in OR per consult order). RUE edematous with some bruising and pink partial thickness skin loss. Recommend covering with silicone foam bordered dressing changed three times per week.   LUE hematoma with intact skin may remain open to air.   We will sign off at this time.   Wound 03/08/24 Infiltration/Extravasation Arm Anterior;Right;Upper Infiltrated IV in OR (Active)  Properties  Placement Date 03/08/24  Placement Time 1345  Location Arm  Present on Original Admission N  Primary Wound Type Infilt/Extra  Wound Location Orientation Anterior;Right;Upper  Wound Description (Comments) Infiltrated IV in OR    Assessments 03/09/2024 11:15 AM  Wound Image    Dressing Status      Changed  Wound Length (cm) 2.5 cm  Wound Width (cm) 1.5 cm  Wound Depth (cm) 0.1 cm  Wound Surface Area (cm^2) 2.95 cm^2  Wound Volume (cm^3) 0.196 cm^3  Peri-wound Assessment      Clean;Dry;Intact;Edema  Odor None  Site Assessment Pink  Dressing Silicone foam bordered dressing     Lab Results  Component Value Date   WBC 13.4 (H) 03/09/2024   HGB 9.0 (L) 03/09/2024   HCT 26.1 (L) 03/09/2024   GLUF 91 10/26/2019   GLU 149 03/09/2024   ALBUMIN 3.8 02/09/2024   PROT 7.0 02/09/2024    Support Surface:  - Low Air Loss - ICU  Offloading: Left: Pillow Right: Pillow  Type Debridement Completed By  WOCN: N/A  Teaching: - Wound care  WOCN Recommendations:  - Contact WOCN with questions, concerns, or wound deterioration. - Silicone foam bordered dressing to RUE wound.  Topical Therapy/Interventions:  - Silicone bordered foam  Recommended Consults: - Not Applicable  WOCN Follow Up: - We will sign off at this time  Plan of Care Discussed With:  - Patient - Family  Supplies Ordered: No  Workup Time:  45 minutes  Elsie Batty RN BSN Wound, Ostomy and Continence Consult Service

## 2024-03-09 NOTE — Unmapped External Note (Signed)
 ------------------------------------------------------------------------------- Attestation signed by Angela Caretha Anis, MD at 03/10/24 [phone] I was present for the entirety of the procedure(s).  Angela Stein Luwanna MD  -------------------------------------------------------------------------------  Operative Note  Date of Surgery: 03/08/2024  Pre-op Diagnosis:  Aortoiliac occlusive disease Hypertension HLD  Post-op Diagnosis: Same  Procedure(s): -Ultrasound-guided access right common femoral artery -Ultrasound-guided access left common femoral artery -Open exposure of left common femoral, superficial femoral, and profunda femoral -Thromboendarterectomy with bovine patch angioplasty of Left CFA -Introduction catheter aorta x2 -Aortogram -Aortoiliac stenting- 8 mm x 59 mm VBX Kissing stents -Right external iliac stent - 8 mm x 10 cm, 7 mm x 5 cm viabhan -Left external iliac stenting- 8 x 60 Zilver  -Closure of right 8 [place] common femoral artery access using Perclose Proglide x1   Surgeon: Stein Luwanna, MD  Assistant: Fredric Hope, MD, Devere Knee, MD  Findings: Occluded right common iliac artery, densely calcified left common iliac artery. Bilateral kissing iliac stents placed. Left common femoral endarterectomy performed due to dense posterior calcifications and completion angio with severe stenosis.   Anesthesia: General  Estimated Blood Loss: 300 mL  Complications: None  Specimens: None collected  Implants:  Bilateral common iliac - 8 x 59 VBX bilaterally, Right external iliac artery - 8 mm x 10 cm, 7 mm x 5 cm viabahn, Left external iliac - 8 mm x 60 Zilver  Drains: None  Indications: This is a 73 year old female who presents with aortoiliac occlusive disease and bilateral lower extremity rest pain (Rutherford Stage 4).  Aortoiliac stenting with an Endologix device was recommended.  All material risks, benefits and alternatives were described in detail.  Their questions were answered and they elected to proceed. Informed consent was obtained.  Procedure in Detail: The patient was brought to the operative suite and placed in the supine position. Pressure points were padded. General anesthesia was administered and the airway was secured. Antibiotics were administered. Timeouts and patient identification was performed per protocol with all parties in agreement. The abdomen and bilateral groins were prepped and draped in a sterile manner.   Under ultrasound guidance the ipsilateral right common femoral artery was accessed with a 21-gauge needle. An image was archived and saved.  A skin nick was made to accommodate the larger sheath.  A Glide wire was advanced into the abdominal aorta. The micropuncture sheath was exchanged for a 6 [place] hemostatic sheath. An 6 French sheath was placed.  Under ultrasound guidance the contralateral left common femoral artery was accessed with a 21-gauge needle.  It was noted to have severe calcifications with limited locations for access however we were able to successfully access the artery using ultrasound guidance.  An image was archived and saved. A Glide wire was advanced into the abdominal aorta. The micropuncture sheath was exchanged for a 6 [place] hemostatic sheath.   The patient was systemically heparinized for goal ACT of greater than 200 and this was redosed throughout the entirety of the case in combination with the anesthesiologist.  We started in the left groin and advanced a Kumpe and a Glidewire up the left iliac system.  There was very significantly calcified stenosis and the proximal left common iliac artery.  We were able to cross the stenosis and an angiogram was obtained in the infrarenal aorta which confirmed true lumen.  We then exchanged with a 6 [place] by 10 cm sheath for an 8 x 25 cm sheath over a Rosen wire and this was advanced  into the infrarenal aorta.  At this point we turned our attention to the  right groin and attempted crossing the occluded right iliac artery.  We started with retrograde access and attempted to cross into a subintimal plane and pop back into the true lumen in the infrarenal aorta however this was unsuccessful and we were unable to pass the catheter from retrograde access so we turned our attention back to the left groin and decided to exchange the 8 [place] by 25 [place] sheath to a 6-1/2 [place] tour guide.  The tour guide was formed and we used a series of wires and catheters to obtain antegrade access over the bifurcation via a subintimal plane down the right iliac artery.  Once the Glidewire had advanced more distally into the right iliac artery and a subintimal plane, we decided to advance a gooseneck snare from the right groin up and the Glidewire from the contralateral side was snared in a dissection plane.  This was pulled through for through and through access and bilateral groins.  The 6 French sheath in the right groin was then exchanged for an 8 [place] by 25 [place] sheath.  The 8 French sheath was then advanced into the infrarenal aorta until the tour guide and the 8 [place] sheath met above the aortic bifurcation.  At this point we felt comfortable with the both the 8 French sheath and the tour guide were in true lumen so we lost through and through wire access and advanced Kumpe's into the infrarenal aorta from the right groin and the tour guide was straightened and a Kumpe was advanced via the left groin as well.  An angiogram was obtained from both access sites which showed true lumen from the right and the left groin.  Next we decided to advance Amplatz wires bilaterally via the Kumpe catheters.  These were buried in the thoracic aorta.  At this point we began with our kissing iliac stents.  An 58x59 VBX was advanced over both Amplatz wires and positioned below the IMA but above the aortic bifurcation to ensure adequate aortic inflow.  The VBX stents were deployed under  fluoroscopic guidance.  We then turned our attention to the right iliac system and an angiogram was obtained which showed no evidence of a hypogastric artery and the distal external iliac artery just prior to the epigastric artery appeared healthy without evidence of stenosis in the common femoral.  So we decided to reline the entire right iliac system with a Viabahn.  An 8 mm x 10 cm Viabahn was then advanced over the Amplatz wire in the right groin.  This was placed with appropriate overlap into the VBX stent.  The Viabahn stent was deployed under fluoroscopic guidance.  And an additional 7 mm x 5 cm Viabahn was placed and landed just proximal to the epigastric artery.  The Viabahn was then ballooned throughout with a 7 mm balloon to nominal pressures.  A post stent angiogram was obtained which showed brisk flow through the right iliac system with no evidence of in-stent stenosis or compression.   We then turned our attention to the left iliac and following placement of the VBX, a left iliac angiogram was obtained which showed a patent left iliac system however there was a dissection that extended across the hypogastric origin into the proximal external iliac artery.  There was no evidence of distal external iliac artery stenosis so we opted to stent the left external with an 8 mm x 60 mm Zilver over the hypogastric origin.  A repeat angiogram following stenting showed patent but sluggish flow so we had some concerns for significant stenosis or disease in the left common femoral artery.  We decided to remove the left groin sheath first and maintain right groin access so that we could take a completion angiogram following tying down the left groin Pro-glide.  The tour guide sheath was removed and we deployed a single Pro-glide closure device.  The Pro-glide was tied down and the groin access site appeared hemostatic but there was not a good dopplerable signal in the left common femoral.  We then advanced a V S1  catheter into the right groin and hooked the top of the kissing iliac stents and took another repeat angiogram of the left common femoral artery.  This showed that there was significant stenosis and plaque disruption following access on the left side due to a large posterior plaque.  We at this point decided to cut down on the left common femoral artery to do a endarterectomy and patch angioplasty.  A longitudinal incision was made overlying the left common femoral artery.  The incision was carried down through subcutaneous tissues using Bovie cautery.  The left common femoral artery was identified at the level of the inguinal ligament.  We continued the dissection distally and was able to identify the SFA and several profunda branches.  Proximal and distal control was obtained using Vesseloops on the common femoral artery and SFA and profunda.  At this point with proximal and distal control, we clamped the proximal common femoral artery with a Cooley J clamp and the SFA and profunda were clamped with profunda clamps.  The artery was opened with an 11 blade and the arteriotomy was lengthened with Potts scissors.  The plaque was elevated using Runner, broadcasting/film/video.  The plaque was then cut flush at the profunda origin to allow us  to tack the plaque with our patch.  The 0.8 x 8 bovine pericardial patch was then brought onto the field the patch was sewn on with a 5-0 Prolene in a running fashion being sure to tack the plaque overlying the profunda origin to the sidewall.  Prior to tying down our last knot, the artery was forward and backbled with brisk profunda bleeding and brisk common femoral artery bleeding.  The suture was tied down and she was noted to have multiphasic SFA and profunda signals following patch angioplasty.  Lastly we turned our attention back to the right groin, wire was advanced into the indwelling catheter and sheath and a Pro-glide was advanced over the wire.  This was deployed in the 12  o'clock position and the site appeared hemostatic with a multiphasic common femoral artery signal.  The pro-glide was tied down and hemostasis was obtained with manual pressure.  The patient heparin  was reversed with protamine  and we began closure of the left groin.  The groin incision was closed in multiple layers using interrupted 3-0 Vicryl stitches.  The skin was closed with 3 oh deep dermals and the's epidermis was ran with a 4-0 Monocryl suture in a running subcuticular fashion.  The left groin was covered with a Prevena incisional wound VAC and the right groin was closed with a single 3-0 deep dermal Vicryl suture and glued with Dermabond.  The patient tolerated the procedure well.  They were awoken from anesthesia and transported to the recovery room in good condition.  I was present and scrubbed for the entire procedure.   Postoperative Plan: Admit to hospital.

## 2024-03-09 NOTE — Consults (Signed)
 Care Management Initial Transition Planning Assessment            General Care Manager / Social Worker assessed the patient by : In person interview with patient Orientation Level: Oriented X4 Functional level prior to admission: Independent Reason for referral: Discharge Planning  Contact/Decision [facility] Extended Emergency Contact Information Primary Emergency Contact: Loja,Richard Address: [address]  [place] , [place] [address]  Home Phone: [phone] Mobile Phone: [phone] Relation: Spouse Secondary Emergency Contact: Choy,Scott Address: [address]  [place] Phone: [phone] Relation: Son Type of Residence: Mailing Address:  [address] [employer] [place] [address] Contacts: Accompanied by: Family member Patient Phone Number: [phone] (mobile)       Medical Provider(s): Toribio Jerel Area, MD Reason for Admission: Admitting Diagnosis:  aorto-occlusive disease Past Medical History:   has a past medical history of Atrial fibrillation, C. difficile diarrhea (2020), Cervical disc disorder, CHF (congestive heart failure), Coronary artery disease, Endometriosis, Fractures, Hypertension, Neuropathy, Pain, Shingles, and Spinal stenosis. Past Surgical History:   has a past surgical history that includes broken collar bone; Shoulder surgery; broken foot; broken leg; pr compre ep eval abltj atr fib pulm vein isolation (N/A, 07/22/2019); Hysterectomy (1986); Oophorectomy; pr colonoscopy w/biopsy single/multiple (N/A, 11/01/2021); and pr prepare fecal microbiota for instillation (11/01/2021).  Previous admit date: 07/22/2019  Primary Insurance- Payor: HU[employer]NA MEDICARE ADV / Plan: HUMANA MEDICARE ADV PPO / Product Type: *No Product type* /  Secondary Insurance - None Prescription Coverage - Insurance Preferred Pharmacy - [facility] [place]  - [address] [facility] - [place], [place] - [address]  Transportation home:  Horticulturist, commercial Next of Kin / Guardian / POA / Advance Directives    HCDM (patient stated preference): Celestine, Prim Spouse - [phone]     Health Care Decision Maker [HCDM] (Medical & Mental Health Treatment) Healthcare Decision Maker: HCDM documented in the HCDM/Contact Info section.     Readmission Information  Have you been hospitalized in the last 30 days?: No       Did the following happen with your discharge?       Patient Information Lives with: Spouse/significant other  Type of Residence: Private residence     Support Systems/Concerns: Spouse  Responsibilities/Dependents at home?: No  Home Care services in place prior to admission?: No     Equipment Currently Used at Home: walker, rolling    Currently receiving outpatient dialysis?: No    Financial Information    Need for financial assistance?: No    Social Determinants of Health Social Drivers of Health   Food Insecurity: No Food Insecurity (12/07/2023)   Hunger Vital Sign   . Worried About Programme researcher, broadcasting/film/video in the Last Year: Never true   . Ran Out of Food in the Last Year: Never true  Tobacco Use: Medium Risk (03/07/2024)   Patient History   . Smoking Tobacco Use: Former   . Smokeless Tobacco Use: Never   . Passive Exposure: Past  Transportation Needs: No Transportation Needs (12/07/2023)   PRAPARE - Transportation   . Lack of Transportation (Medical): No   . Lack of Transportation (Non-Medical): No  Alcohol  Use: Not At Risk (12/07/2023)   Alcohol  Use   . How often do you have a drink containing alcohol ?: Never   . How many drinks containing alcohol  do you have on a typical day when you are drinking?: 1 - 2   . How often  do you have 5 or more drinks on one occasion?: Never  Housing: Low Risk  (12/07/2023)   Housing   . Within the past 12 months, have you ever stayed: outside, in a car, in a tent, in an overnight shelter, or temporarily in someone else's home (i.e.  couch-surfing)?: No   . Are you worried about losing your housing?: No  Physical Activity: Inactive (12/07/2023)   Exercise Vital Sign   . Days of Exercise per Week: 0 days   . Minutes of Exercise per Session: 0 min  Utilities: Low Risk  (12/07/2023)   Utilities   . Within the past 12 months, have you been unable to get utilities (heat, electricity) when it was really needed?: No  Stress: No Stress Concern Present (12/07/2023)   [employer] of Occupational Health - Occupational Stress Questionnaire   . Feeling of Stress : Not at all  Interpersonal Safety: Not At Risk (12/14/2023)   Interpersonal Safety   . Unsafe Where You Currently Live: No   . Physically Hurt by Anyone: No   . Abused by Anyone: No  Substance Use: Not on file (06/15/2023)  Intimate Partner Violence: Not At Risk (12/07/2023)   Humiliation, Afraid, Rape, and Kick questionnaire   . Fear of Current or Ex-Partner: No   . Emotionally Abused: No   . Physically Abused: No   . Sexually Abused: No  Social Connections: Not on file  Financial Resource Strain: Low Risk  (12/07/2023)   Overall Financial Resource Strain (CARDIA)   . Difficulty of Paying Living Expenses: Not hard at all  Health Literacy: Low Risk  (01/20/2022)   Health Literacy   . : Never  Internet Connectivity: No Internet connectivity concern identified (12/07/2023)   Internet Connectivity   . Do you have access to internet services: Yes   . How do you connect to the internet: Personal Device at home   . Is your internet connection strong enough for you to watch video on your device without major problems?: Yes   . Do you have enough data to get through the month?: Yes   . Does at least one of the devices have a camera that you can use for video chat?: Yes    Complex Discharge Information        Interventions:    Discharge Needs Assessment Concerns to be Addressed: discharge planning  Clinical Risk Factors: > 65, Multiple Diagnoses  (Chronic)  Barriers to taking medications: No    Anticipated Changes Related to Illness: inability to care for self     Discharge Facility/Level of Care Needs:    Readmission Risk of Unplanned Readmission Score: UNPLANNED READMISSION SCORE: 11.36% Predictive Model Details        11%  Factor Value   Calculated 03/09/2024 08:08 20% Number of active inpatient medication orders 20   [facility] Risk of Unplanned Readmission Model 14% ECG/EKG order present in last 6 months    14% Latest calcium  low (7.9 mg/dL)    89% Imaging order present in last 6 months    9% Latest hemoglobin low (9.0 g/dL)    9% Age 26    9% Phosphorous result present    7% Active anticoagulant inpatient medication order present    5% Charlson Comorbidity Index 3    3% Future appointment scheduled    1% Current length of stay 1.005 days    Readmitted Within the Last 30 Days? (No if blank)  Patient at risk for readmission?: No  Discharge  Plan Screen findings are: Discharge planning needs identified or anticipated (Comment).  Expected Discharge Date: 03/09/2024  Expected Transfer from Critical Care:           Initial Assessment complete?: Yes

## 2024-03-09 NOTE — Discharge Summary (Signed)
 ------------------------------------------------------------------------------- Attestation signed by [name], MD at 03/09/24 1512 I saw and evaluated the patient, participating in the key portions of the service.  I reviewed the resident's note.  I agree with the resident's findings and plan.   [name] MD  -------------------------------------------------------------------------------   Discharge Summary  Admit date: 03/08/2024  Discharge date: 03/09/2024  Discharge to:  Home  Discharge Service: Surg Vascular (SRV)  Discharge Attending Physician: Caretha Anis Luwanna, MD  Discharge  Diagnoses: Aorto-occlusive disease   Secondary Diagnosis: Active Problems:   Spinal stenosis of lumbar region with neurogenic claudication (POA: Yes)   Atrial fibrillation    (POA: Yes)   Hypertension (POA: Yes)   Chronic heart failure with preserved ejection fraction    (POA: Yes)   Chronic anticoagulation (POA: Not Applicable)   Peripheral artery disease (CMS-HCC) (POA: Yes) Resolved Problems:   * No resolved hospital problems. *   OR Procedures:   Right - THROMBOENDARTERECTOMT, WITH OR WITHOUT PATCH GRAFT; COMMON FEMORAL Date 03/08/2024 -------------------   Ancillary Procedures: no procedures  Discharge Day Services:  The patient was seen and examined by the Vascular Surgery team on the day of discharge. Vital signs and laboratory values were stable and deemed appropriate for discharge. Surgical wounds were examined and found to be clean, dry, and intact. Discharge plan was discussed, instructions for home care were given, and all questions answered.   Subjective  No acute events overnight. Pain Controlled.  Objective  Patient Vitals for the past 8 hrs:  BP Temp Temp src Pulse SpO2 Pulse Resp SpO2  03/09/24 1300 -- -- -- 67 66 21 97 %  03/09/24 1200 141/60 36.7 C (98.1 F) -- 69 68 21 95 %  03/09/24 1000 -- -- -- 80 81 19 93 %  03/09/24 0900 -- -- --  81 82 19 94 %  03/09/24 0845 158/59 -- -- 81 -- -- --  03/09/24 0800 -- 36.7 C (98.1 F) Oral 85 84 17 93 %   I/O this shift: In: -  Out: 100 [Urine:100]  General Appearance:   No acute distress Lungs:                NWOB on ra Heart:                           Regular rate and rhythm Abdomen:                Soft, non-tender, non-distended Extremities:              Warm and well perfused, DP/PT signals present on BLE. L groin prevena vac suctioning appropriately  Hospital Course:  The patient is a 73 year old female with HTN, afib on eliquis, HFpEF and aorto-iliac occlusive disease with life-limiting symptoms.  She underwent planned bilateral iliac artery stent and left common femoral endarterectomy on 7/30.    She tolerated the procedure well, was extubated in the OR, and was taken to the PACU where she received routine postoperative care before being transferred to stepdown. Incisional wound vac was placed on L groin She did well postoperatively. Her diet was slowly advanced and at the time of discharge she was tolerating a regular diet. The patient was able to void spontaneously and her pain was well-controlled with P.O. pain medication. T he patient was able to ambulate with minimal assistance and was deemed suitable to discharge to home with family care on POD1. She will keep  her wound vac in place until 8/4 then remove and discard. Plan for 1 month follow up with duplex.    Condition at Discharge: Improved Discharge Medications:    Medication List    START taking these medications   . acetaminophen  500 MG tablet; Commonly known as: TYLENOL ; Take 2 tablets  (1,000 mg total) by mouth every eight (8) hours.   CONTINUE taking these medications   . amlodipine  5 MG tablet; Commonly known as: NORVASC ; TAKE 1 TABLET BY  MOUTH DAILY . aspirin  81 MG tablet; Commonly known as: ECOTRIN . calcium -vitamin D 500 mg-5 mcg (200 unit) per tablet . dofetilide  500 MCG capsule; Commonly known as:  TIKOSYN ; Take 1 capsule  (500 mcg total) by mouth every twelve (12) hours. SABRA ELIQUIS 5 mg Tab; Generic drug: apixaban; TAKE 1 TABLET BY MOUTH TWICE  DAILY . ezetimibe  10 mg tablet; Commonly known as: ZETIA  . furosemide  40 MG tablet; Commonly known as: LASIX ; TAKE 1 TABLET BY  MOUTH EVERY DAY . losartan  50 MG tablet; Commonly known as: COZAAR ; Take 1 tablet (50 mg  total) by mouth Two (2) times a day. . metoPROLOL  tartrate 100 MG tablet; Commonly known as: Lopressor ; Take 1  tablet (100 mg total) by mouth two (2) times a day. . rosuvastatin  10 MG tablet; Commonly known as: CRESTOR  . Saccharomyces boulardii 250 mg capsule; Commonly known as: FLORASTOR . valACYclovir 1000 MG tablet; Commonly known as: VALTREX      Other Instructions: Other Instructions     Discharge instructions     1) Dial 911 for emergencies.   2) Mon-Fri, 8am-4pm, call the Vascular Surgery clinic at [phone] for:  - increasing pain in legs or feet  - development of skin breakdown at your incision site  - fever > 101 F  - signs or symptoms of infection at incision site such as increased redness, swelling, pain, or drainage  3) For emergencies after-hours: call the [facility] operator [phone]) to page the General Surgery resident on call (your question will be directed to a surgery resident who is not immediately aware of the details of your case, but can help you deal with any emergencies that cannot wait until regular business hours). Please contact for and go to the nearest emergency room for:  - new or sudden inability to move legs or feet  - blue / purple discoloration of the leg, foot, or toes.  4) You may shower starting after you remove your wound vac dressing on 08/04, do not scrub surgical sites. Any staples will be removed in clinic and any surgical glue will dissolve over time, you may trim any pieces that become lose. No pools, hot tubs or swimming until seen in clinic  5) You should  take Tylenol  1000mg  every 6-8 hours as needed for pain.   6)  Resume home medications as listed under discharge medications.   Avoid lifting >15lb, deep squats or performing activities that require long period of bending at the waist (such as gardening) for 2 weeks  Prevena wound vac: You have been prescribed a Prevena Therapy- this is a system that uses gentle vacuum pressure to protect your incision. This portable unit should always be kept turned on once applied. The unit can be worn under or over your clothing and includes a carrying case with adjustable straps, so you can use it multiple ways. During operation, the dressing should have a wrinkled appearance and the foam should be compressed. The machine will need to be  charged every 6-8 hours. There is a low-battery indicator on the machine. Please remove this dressing by peeling tape off slowly. Once dressing is off, you do not need to place another one. You may tuck guaze or a clean wash cloth into your groin fold when sitting to prevent moisture build up. Please remove you dressing on 03/14/2024      Labs and Other Follow-ups after Discharge: Follow Up instructions and Outpatient Referrals    Discharge instructions       Future Appointments: Appointments which have been scheduled for you    Apr 06, 2024 2:00 PM (Arrive by 1:30 PM) VASCULAR ULTRASOUND ARTERIAL DUPLEX LOWER EXTREMITY BILATERAL with [facility] PVL OUTPATIENT 2 IMG PVL [facility]) [address] [place] [address] [phone]     Apr 06, 2024 3:15 PM (Arrive by 2:45 PM) RETURN VASCULAR with Caretha Altamease Chant, MD [facility] VASCULAR SURGERY [place] [facility] REGION) [address] [place] [address] [phone]     May 30, 2024 11:00 AM (Arrive by 10:45 AM) ADULT PERIPHERAL DRAW with [facility] ONC PERIPHERAL LAB DRAW [facility] CANCER CARE [facility] HEMATOLOGY ONCOLOGY [place] [facility]) [address] [place] [address] [phone]     Jun 06, 2024 11:00 AM (Arrive by 10:45 AM) OFFICE VISIT with Maude Eulah Chimes, MD [facility] CANCER CARE [facility] HEMATOLOGY ONCOLOGY [place] [facility]) [address] [place] [address] [phone]     Jun 08, 2024 1:30 PM (Arrive by 1:15 PM) INFUSION ONLY with [facility] INFUSION CHAIR 02 [facility] CANCER CARE [facility] ONCOLOGY INFUSION [place] [facility] [place] [facility]) [address] [place] [address] [phone]     Jun 20, 2024 11:40 AM (Arrive by 11:25 AM) OFFICE VISIT with Faisal Fiazuddin Syed, MD [facility] CARDIOLOGY [place] [facility] REGION) [address] [facility] 1 through 4 [place] [address] [phone]     Jul 04, 2024 1:00 PM (Arrive by 12:30 PM) RETURN CARDIOLOGY with CARDIO [place] PROVIDER [facility] CARDIOLOGY AT [place] [facility] [place]) [address] [place] [address] [phone]

## 2024-03-09 NOTE — Progress Notes (Signed)
 ------------------------------------------------------------------------------- Attestation signed by [name], Stein at 03/09/24 0830 I saw and evaluated the patient, participating in the key portions of the service.  I reviewed the resident's note.  I agree with the resident's findings and plan.   Angela Stein  -------------------------------------------------------------------------------  SRV Progress Note  Admit Date: 03/08/2024, Hospital Day: 2 Hospital Service: Surg Vascular (SRV) Attending: Caretha Anis Luwanna, Stein  Assessment  Angela Stein is a 73 y.o. female, with HTN, afib on eliquis, HFpEF, 1 Day Post-Op s/p Bilateral kissing iliac stents placed. Left common femoral endarterectomy . She is doing well post operatively.   Interval Events/Subjective: Pain well controlled. AUOP. Provena with minimal output.  Should be able to go home today if she ambulates well.   Plan  Neuro/Pain: -SCH: Tylenol  -PRN: Oxycodone    Cardiovascular: -HDS - on home Tikosyn  - pending EKG for med monitoring  - restart home eliquis    Pulmonary: -SORA -IS/OOB/ambulate  FEN/GI: -F: ML -E: Replete lytes prn - Mg 2.4, Phos 4.2, K 4.3  -N: Nutrition Therapy Regular/House -Bowel regimen:  -Nausea:   GU/Renal: -Remove Foley, ToV -Adequate UOP -SCr  Recent Labs    03/09/24 0437  CREATININE 0.61     Heme/ID: -Hgb  Recent Labs    03/09/24 0437  HGB 9.0*    -WBC  Recent Labs    03/09/24 0437  WBC 13.4*     Endocrine: - NAI  Vascular: - s/p Bilateral kissing iliac stents placed. Left common femoral endarterectomy   PPx: Lovenox  Dispo:  Floor status PT/OT: n/a  CM/SW is assisting in discharge planning Barriers to discharge: Anticipate patient to be medically ready for discharge in 24-48 hours  Vitals:  Temp:  [36 C (96.8 F)-36.9 C (98.4 F)] 36.8 C (98.2 F) Pulse:  [58-74] 74 SpO2 Pulse:  [58-74] 74 Resp:  [12-23] 16 BP:  (106-111)/(41-65) 111/65 MAP (mmHg):  [61-62] 62 A BP-1: (121-153)/(40-52) 153/48 MAP:  [67 mmHg-83 mmHg] 83 mmHg SpO2:  [92 %-98 %] 96 %  Intake/Output last 24 hours:  Intake/Output Summary (Last 24 hours) at 03/09/2024 0731 Last data filed at 03/09/2024 0600 Gross per 24 hour  Intake 3108.75 ml  Output 1770 ml  Net 1338.75 ml    Physical Exam: -General:  Appropriate, comfortable and in no apparent distress. -Neurological: Alert and oriented. Moves all 4 extremities spontaneously.  -Cardiovascular: Regular rate. -Pulmonary: Normal work of breathing on RA. No accessory muscle use. -Abdomen: Soft, non-tender, non-distended. No guarding. Groins without hematoma. Provera with no output. -Extremities: Warm, well perfused.   L    DP (palpable), PT (palpable)  R   DP (dopplerable), PT (dopplerable) -----------------------------------------------------  Data Review: All lab results last 24 hours:   Recent Results (from the past 24 hours)  POCT Activated Clotting Time   Collection Time: 03/08/24  8:37 AM  Result Value Ref Range   Activated Clotting Time 104 Provider Interpretation (sec)  POCT Activated Clotting Time   Collection Time: 03/08/24  9:16 AM  Result Value Ref Range   Activated Clotting Time 115 Provider Interpretation (sec)  POCT Activated Clotting Time   Collection Time: 03/08/24  9:22 AM  Result Value Ref Range   Activated Clotting Time 101 Provider Interpretation (sec)  POCT Activated Clotting Time   Collection Time: 03/08/24 10:36 AM  Result Value Ref Range   Activated Clotting Time 279 Provider Interpretation (sec)  POCT Activated Clotting Time   Collection Time: 03/08/24 11:09 AM  Result Value Ref Range  Activated Clotting Time 221 Provider Interpretation (sec)  ABGs w/LYTES   Collection Time: 03/08/24 11:09 AM  Result Value Ref Range   Specimen Source Arterial    FIO2 Arterial 45%    pH, Arterial 7.37 7.35 - 7.45   pCO2, Arterial 40.6 35.0 - 45.0 mm Hg    pO2, Arterial 202.0 (H) 80.0 - 110.0 mm Hg   HCO3 (Bicarbonate), Arterial 23 22 - 27 mmol/L   Base Excess, Arterial -1.8 -2.0 - 2.0   O2 Sat, Arterial 99.6 94.0 - 100.0 %   Sodium Whole Blood 137 135 - 145 mmol/L   Potassium, Bld 4.1 3.4 - 4.6 mmol/L   Calcium , Ionized Arterial 4.45 4.40 - 5.40 mg/dL   Glucose Whole Blood 824 70 - 179 mg/dL   Lactate, Arterial 0.9 <1.3 mmol/L   Hgb, blood gas 11.00 (L) 12.00 - 16.00 g/dL  Magnesium  Level   Collection Time: 03/08/24 11:09 AM  Result Value Ref Range   Magnesium  2.1 1.6 - 2.6 mg/dL  Phosphorus Level   Collection Time: 03/08/24 11:09 AM  Result Value Ref Range   Phosphorus 2.9 2.4 - 5.1 mg/dL  POCT Activated Clotting Time   Collection Time: 03/08/24 11:39 AM  Result Value Ref Range   Activated Clotting Time 253 Provider Interpretation (sec)  POCT Activated Clotting Time   Collection Time: 03/08/24 11:48 AM  Result Value Ref Range   Activated Clotting Time 279 Provider Interpretation (sec)  POCT Activated Clotting Time   Collection Time: 03/08/24 11:57 AM  Result Value Ref Range   Activated Clotting Time 151 Provider Interpretation (sec)  POCT Activated Clotting Time   Collection Time: 03/08/24 12:05 PM  Result Value Ref Range   Activated Clotting Time 116 Provider Interpretation (sec)  Basic Metabolic Panel   Collection Time: 03/09/24  4:37 AM  Result Value Ref Range   Sodium 135 135 - 145 mmol/L   Potassium 4.3 3.4 - 4.8 mmol/L   Chloride 102 98 - 107 mmol/L   CO2 25.0 20.0 - 31.0 mmol/L   Anion Gap 8 5 - 14 mmol/L   BUN 17 9 - 23 mg/dL   Creatinine 9.38 9.44 - 1.02 mg/dL   BUN/Creatinine Ratio 28    eGFR CKD-EPI (2021) Female >90 >=60 mL/min/1.61m2   Glucose 149 70 - 179 mg/dL   Calcium  7.9 (L) 8.7 - 10.4 mg/dL  Magnesium  Level   Collection Time: 03/09/24  4:37 AM  Result Value Ref Range   Magnesium  2.4 1.6 - 2.6 mg/dL  Phosphorus Level   Collection Time: 03/09/24  4:37 AM  Result Value Ref Range   Phosphorus  4.2 2.4 - 5.1 mg/dL  CBC w/ Differential   Collection Time: 03/09/24  4:37 AM  Result Value Ref Range   WBC 13.4 (H) 3.6 - 11.2 10*9/L   RBC 3.00 (L) 3.95 - 5.13 10*12/L   HGB 9.0 (L) 11.3 - 14.9 g/dL   HCT 73.8 (L) 65.9 - 55.9 %   MCV 87.0 77.6 - 95.7 fL   MCH 30.1 25.9 - 32.4 pg   MCHC 34.6 32.0 - 36.0 g/dL   RDW 85.4 87.7 - 84.7 %   MPV 8.1 6.8 - 10.7 fL   Platelet 226 150 - 450 10*9/L   Neutrophils % 78.5 %   Lymphocytes % 11.1 %   Monocytes % 10.2 %   Eosinophils % 0.0 %   Basophils % 0.2 %   Absolute Neutrophils 10.5 (H) 1.8 - 7.8 10*9/L   Absolute Lymphocytes  1.5 1.1 - 3.6 10*9/L   Absolute Monocytes 1.4 (H) 0.3 - 0.8 10*9/L   Absolute Eosinophils 0.0 0.0 - 0.5 10*9/L   Absolute Basophils 0.0 0.0 - 0.1 10*9/L    Imaging: Radiology studies were personally reviewed    Devere Knee Stein, DDS, PGY-4 General Surgery The above was dictated via Office manager.  All attempts to correct errors have been made.  Please pardon any errors.

## 2024-03-10 ENCOUNTER — Telehealth: Payer: Self-pay

## 2024-03-10 DIAGNOSIS — I739 Peripheral vascular disease, unspecified: Secondary | ICD-10-CM | POA: Diagnosis not present

## 2024-03-10 NOTE — Transitions of Care (Post Inpatient/ED Visit) (Signed)
 Today's TOC FU Call Status: Today's TOC FU Call Status:: Successful TOC FU Call Completed TOC FU Call Complete Date: 03/10/24 Patient's Name and Date of Birth confirmed.  Transition Care Management Follow-up Telephone Call Date of Discharge: 03/09/24 Discharge Facility: Other ([facility] Facility) Name of Other ([facility]) Discharge Facility: [facility] Type of Discharge: Inpatient Admission Primary Inpatient Discharge Diagnosis:: Aorto-occlusive disease: s/p stent placement How have you been since you were released from the hospital?: Better Any questions or concerns?: No  Background: This patient is a 73 year old female with HTN, afib on eliquis, HFpEF and aorto-iliac occlusive disease with life-limiting symptoms. Patient underwent planned bilateral iliac artery stent and left common femoral endarterectomy on 03/09/24 at [facility], [place].   Items Reviewed: Did you receive and understand the discharge instructions provided?: Yes Medications obtained,verified, and reconciled?: Yes (Medications Reviewed) Any new allergies since your discharge?: No Dietary orders reviewed?: NA Do you have support at home?: Yes People in Home [RPT]: spouse Name of Support/Comfort Primary Source: Angela Stein, Angela Stein (Spouse)  [phone] (Mobile)  Medications Reviewed Today: Medications Reviewed Today     Reviewed by Carolee Heron NOVAK, RN (Case Manager) on 03/10/24 at 1052  Med List Status: <None>   Medication Order Taking? Sig Documenting Provider Last Dose Status Informant  amLODipine  (NORVASC ) 5 MG tablet [id number] Yes Take 5 mg by mouth in the morning. [provider]  Active Self  apixaban (ELIQUIS) 5 MG TABS tablet [id number] Yes Take 1 tablet (5 mg total) by mouth 2 (two) times daily. Golda Claudis PENNER, MD  Active Self  aspirin  EC 81 MG tablet [id number] Yes Take 81 mg by mouth every evening. Swallow whole. [provider]  Active Self  Calcium  Carbonate (CALCIUM  600 PO) [id number] Yes Take 600  mg by mouth 2 (two) times daily. [provider]  Active Self  Cholecalciferol (VITAMIN D3) 25 MCG (1000 UT) CAPS [id number] Yes Take 1,000 Units by mouth 2 (two) times daily.  [provider]  Active Self  dofetilide  (TIKOSYN ) 500 MCG capsule [id number] Yes Take 500 mcg by mouth every 12 (twelve) hours. [provider]  Active Self  ezetimibe  (ZETIA ) 10 MG tablet [id number] Yes Take 10 mg by mouth every evening. [provider]  Active Self  furosemide  (LASIX ) 40 MG tablet [id number] Yes Take 20-40 mg by mouth daily as needed for fluid. [provider]  Active Self  losartan  (COZAAR ) 50 MG tablet [id number] Yes Take 50 mg by mouth 2 (two) times daily. [provider]  Active Self  metoprolol  tartrate (LOPRESSOR ) 50 MG tablet [id number] Yes Take 75 mg by mouth 2 (two) times daily.  [provider]  Active Self  rosuvastatin  (CRESTOR ) 10 MG tablet [id number] Yes Take 1 tablet (10 mg total) by mouth daily. Bethanie Cough, PA-C  Active   saccharomyces boulardii (FLORASTOR) 250 MG capsule [id number] Yes Take 250 mg by mouth daily. [provider]  Active Self  valACYclovir (VALTREX) 1000 MG tablet [id number] Yes Take 1,000 mg by mouth daily as needed (shingles/mouth ulcers). [provider]  Active Self            Home Care and Equipment/Supplies: Were Home Health Services Ordered?: No Any new equipment or medical supplies ordered?: No  Functional Questionnaire: Do you need assistance with bathing/showering or dressing?: No Do you need assistance with meal preparation?: No Do you need assistance with eating?: No Do you have difficulty maintaining continence: No Do you need assistance with getting out of bed/getting out  of a chair/moving?: No Do you have difficulty managing or taking your medications?: No  Follow up appointments reviewed: PCP Follow-up appointment confirmed?: Yes Date of PCP follow-up appointment?:  03/11/24 Follow-up Provider: Jerel Kipper at [facility] Follow-up appointment confirmed?: Yes Date of Specialist follow-up appointment?: 04/06/24 Follow-Up Specialty Provider:: Cardiologist/procedure follow up at [facility] Do you need transportation to your follow-up appointment?: No Do you understand care options if your condition(s) worsen?: Yes-patient verbalized understanding (Reviewed specific instructions from discharge summary with patient.)  SDOH Interventions Today    Flowsheet Row Most Recent Value  SDOH Interventions   Food Insecurity Interventions Intervention Not Indicated  Housing Interventions Intervention Not Indicated  Transportation Interventions Intervention Not Indicated, Patient Resources (Friends/Family)  Utilities Interventions Intervention Not Indicated  Health Literacy Interventions Intervention Not Indicated   03/10/24 TOC RN CM completed post discharge outreach with a successful call and TOC follow up; but patient declined need for additional follow up calls (30 day program)  Assessment and other notes: (Full Assessment completed, not showing here in TOC note).  No issues on review of systems other than Pain score of 1-2 from post procedure at bilateral groin sites. Directly denied chest pain or shortness of breath when questioned by RN CM on this call.  Denied any other system issues or issues related to other active problems listed on DC Summary including: HTN, afib on eliquis, HFpEF.  Reviewed bleeding precautions/fall precautions.  No issues with ADL's at home post discharge: No home health or new DME ordered.  Patient is aware of how to contact [employer] benefits when this was discussed.  Has PCP and specialist follow up scheduled and contact information.  Lives at home with spousal support.  Medication review based on DC Summary from [facility] for this discharge.  No other questions or concerns voiced by patient when asked.   Integumentary  Assessment Note:   RUE: Reported as swollen. Has IV extravasation documented and dressing/foam intructions, patient has supplies and verbalized understanding of instructions from inpatient wound consult.  LUE: Hematoma open to air per wound consult. Patient states just from being in hospital. Bilateral s/p stent procedure on 03/09/24 with no reported issues, drainage, bleeding, swelling; reviewed what to call provider for from DC Summary instructions.  Wound Vac: This was noted in DC Summary but patient states it was malfunctioning and PCP will adress wound management/care on 03/11/24.by Carolee Heron NOVAK, RN at 03/10/24 1102   Patient has TOC RN CM contact information, but no follow up call has been scheduled due to patient request.    Bing Carolee MSN, RN RN Case Manager [facility]  [facility] Office Hours M-F [phone] Direct Dial: [phone] Main Phone [phone]  Fax: [phone] [email or web address]

## 2024-04-06 DIAGNOSIS — I739 Peripheral vascular disease, unspecified: Secondary | ICD-10-CM | POA: Diagnosis not present

## 2024-04-06 DIAGNOSIS — I2510 Atherosclerotic heart disease of native coronary artery without angina pectoris: Secondary | ICD-10-CM | POA: Diagnosis not present

## 2024-04-06 DIAGNOSIS — Z79899 Other long term (current) drug therapy: Secondary | ICD-10-CM | POA: Diagnosis not present

## 2024-04-06 DIAGNOSIS — I7409 Other arterial embolism and thrombosis of abdominal aorta: Secondary | ICD-10-CM | POA: Diagnosis not present

## 2024-04-06 DIAGNOSIS — Z881 Allergy status to other antibiotic agents status: Secondary | ICD-10-CM | POA: Diagnosis not present

## 2024-04-06 DIAGNOSIS — I5030 Unspecified diastolic (congestive) heart failure: Secondary | ICD-10-CM | POA: Diagnosis not present

## 2024-04-06 DIAGNOSIS — Z7982 Long term (current) use of aspirin: Secondary | ICD-10-CM | POA: Diagnosis not present

## 2024-04-06 DIAGNOSIS — I110 Hypertensive heart disease with heart failure: Secondary | ICD-10-CM | POA: Diagnosis not present

## 2024-04-06 DIAGNOSIS — I482 Chronic atrial fibrillation, unspecified: Secondary | ICD-10-CM | POA: Diagnosis not present

## 2024-04-08 DIAGNOSIS — J441 Chronic obstructive pulmonary disease with (acute) exacerbation: Secondary | ICD-10-CM | POA: Diagnosis not present

## 2024-04-08 DIAGNOSIS — Z2089 Contact with and (suspected) exposure to other communicable diseases: Secondary | ICD-10-CM | POA: Diagnosis not present

## 2024-04-08 DIAGNOSIS — Z8619 Personal history of other infectious and parasitic diseases: Secondary | ICD-10-CM | POA: Diagnosis not present

## 2024-04-08 DIAGNOSIS — Z6830 Body mass index (BMI) 30.0-30.9, adult: Secondary | ICD-10-CM | POA: Diagnosis not present

## 2024-04-08 DIAGNOSIS — Z20828 Contact with and (suspected) exposure to other viral communicable diseases: Secondary | ICD-10-CM | POA: Diagnosis not present

## 2024-04-21 DIAGNOSIS — I4811 Longstanding persistent atrial fibrillation: Secondary | ICD-10-CM | POA: Diagnosis not present

## 2024-04-21 DIAGNOSIS — I2510 Atherosclerotic heart disease of native coronary artery without angina pectoris: Secondary | ICD-10-CM | POA: Diagnosis not present

## 2024-04-21 DIAGNOSIS — I5032 Chronic diastolic (congestive) heart failure: Secondary | ICD-10-CM | POA: Diagnosis not present

## 2024-04-21 DIAGNOSIS — I081 Rheumatic disorders of both mitral and tricuspid valves: Secondary | ICD-10-CM | POA: Diagnosis not present

## 2024-05-30 DIAGNOSIS — D6859 Other primary thrombophilia: Secondary | ICD-10-CM | POA: Diagnosis not present

## 2024-05-30 DIAGNOSIS — M810 Age-related osteoporosis without current pathological fracture: Secondary | ICD-10-CM | POA: Diagnosis not present

## 2024-06-06 DIAGNOSIS — Z23 Encounter for immunization: Secondary | ICD-10-CM | POA: Diagnosis not present

## 2024-06-07 DIAGNOSIS — M810 Age-related osteoporosis without current pathological fracture: Secondary | ICD-10-CM | POA: Diagnosis not present

## 2024-06-22 DIAGNOSIS — R300 Dysuria: Secondary | ICD-10-CM | POA: Diagnosis not present

## 2024-06-22 DIAGNOSIS — N390 Urinary tract infection, site not specified: Secondary | ICD-10-CM | POA: Diagnosis not present

## 2024-06-22 DIAGNOSIS — Z6829 Body mass index (BMI) 29.0-29.9, adult: Secondary | ICD-10-CM | POA: Diagnosis not present

## 2024-07-04 DIAGNOSIS — I10 Essential (primary) hypertension: Secondary | ICD-10-CM | POA: Diagnosis not present

## 2024-07-04 DIAGNOSIS — I480 Paroxysmal atrial fibrillation: Secondary | ICD-10-CM | POA: Diagnosis not present

## 2024-07-04 DIAGNOSIS — I739 Peripheral vascular disease, unspecified: Secondary | ICD-10-CM | POA: Diagnosis not present

## 2024-07-04 DIAGNOSIS — I2510 Atherosclerotic heart disease of native coronary artery without angina pectoris: Secondary | ICD-10-CM | POA: Diagnosis not present

## 2024-07-11 DIAGNOSIS — I4811 Longstanding persistent atrial fibrillation: Secondary | ICD-10-CM | POA: Diagnosis not present

## 2024-10-24 ENCOUNTER — Ambulatory Visit: Admitting: Cardiology

## 2024-11-01 ENCOUNTER — Ambulatory Visit

## 2024-11-01 ENCOUNTER — Encounter
